# Patient Record
Sex: Male | Born: 1950 | Race: Black or African American | Hispanic: No | Marital: Married | Smoking: Never smoker
Health system: Southern US, Community
[De-identification: ages and names within clinical notes are randomized; demographics above are authoritative.]

## PROBLEM LIST (undated history)

## (undated) DIAGNOSIS — I4891 Unspecified atrial fibrillation: Secondary | ICD-10-CM

## (undated) DIAGNOSIS — Z8719 Personal history of other diseases of the digestive system: Secondary | ICD-10-CM

## (undated) DIAGNOSIS — N529 Male erectile dysfunction, unspecified: Secondary | ICD-10-CM

## (undated) DIAGNOSIS — Z8711 Personal history of peptic ulcer disease: Secondary | ICD-10-CM

## (undated) DIAGNOSIS — R569 Unspecified convulsions: Secondary | ICD-10-CM

## (undated) DIAGNOSIS — I10 Essential (primary) hypertension: Secondary | ICD-10-CM

## (undated) DIAGNOSIS — I639 Cerebral infarction, unspecified: Secondary | ICD-10-CM

## (undated) DIAGNOSIS — E785 Hyperlipidemia, unspecified: Secondary | ICD-10-CM

## (undated) DIAGNOSIS — K5790 Diverticulosis of intestine, part unspecified, without perforation or abscess without bleeding: Secondary | ICD-10-CM

## (undated) DIAGNOSIS — E221 Hyperprolactinemia: Secondary | ICD-10-CM

## (undated) DIAGNOSIS — N400 Enlarged prostate without lower urinary tract symptoms: Secondary | ICD-10-CM

## (undated) DIAGNOSIS — N189 Chronic kidney disease, unspecified: Secondary | ICD-10-CM

## (undated) DIAGNOSIS — D126 Benign neoplasm of colon, unspecified: Secondary | ICD-10-CM

## (undated) DIAGNOSIS — Z86718 Personal history of other venous thrombosis and embolism: Secondary | ICD-10-CM

## (undated) DIAGNOSIS — R972 Elevated prostate specific antigen [PSA]: Secondary | ICD-10-CM

## (undated) DIAGNOSIS — E559 Vitamin D deficiency, unspecified: Secondary | ICD-10-CM

## (undated) DIAGNOSIS — I499 Cardiac arrhythmia, unspecified: Secondary | ICD-10-CM

## (undated) DIAGNOSIS — R339 Retention of urine, unspecified: Secondary | ICD-10-CM

## (undated) DIAGNOSIS — K259 Gastric ulcer, unspecified as acute or chronic, without hemorrhage or perforation: Secondary | ICD-10-CM

## (undated) HISTORY — DX: Elevated prostate specific antigen (PSA): R97.20

## (undated) HISTORY — DX: Cerebral infarction, unspecified: I63.9

## (undated) HISTORY — PX: COLONOSCOPY: SHX174

## (undated) HISTORY — DX: Benign prostatic hyperplasia without lower urinary tract symptoms: N40.0

## (undated) HISTORY — DX: Personal history of peptic ulcer disease: Z87.11

## (undated) HISTORY — DX: Personal history of other diseases of the digestive system: Z87.19

## (undated) HISTORY — DX: Essential (primary) hypertension: I10

## (undated) HISTORY — DX: Hyperprolactinemia: E22.1

## (undated) HISTORY — DX: Unspecified convulsions: R56.9

## (undated) HISTORY — DX: Personal history of other venous thrombosis and embolism: Z86.718

## (undated) HISTORY — DX: Male erectile dysfunction, unspecified: N52.9

## (undated) HISTORY — DX: Hyperlipidemia, unspecified: E78.5

---

## 1982-08-25 HISTORY — PX: OTHER SURGICAL HISTORY: SHX169

## 1989-08-25 HISTORY — PX: SHOULDER SURGERY: SHX246

## 2006-07-13 ENCOUNTER — Ambulatory Visit: Payer: Self-pay | Admitting: Gastroenterology

## 2006-12-01 ENCOUNTER — Ambulatory Visit: Payer: Self-pay | Admitting: Gastroenterology

## 2009-04-02 ENCOUNTER — Ambulatory Visit: Payer: Self-pay | Admitting: Urology

## 2010-02-18 ENCOUNTER — Ambulatory Visit: Payer: Self-pay | Admitting: Gastroenterology

## 2010-10-28 ENCOUNTER — Ambulatory Visit: Payer: Self-pay | Admitting: Internal Medicine

## 2014-10-18 ENCOUNTER — Ambulatory Visit (INDEPENDENT_AMBULATORY_CARE_PROVIDER_SITE_OTHER): Payer: Medicare Other | Admitting: Endocrinology

## 2014-10-18 ENCOUNTER — Encounter: Payer: Self-pay | Admitting: Endocrinology

## 2014-10-18 VITALS — BP 130/84 | HR 72 | Resp 12 | Ht 63.0 in | Wt 193.2 lb

## 2014-10-18 DIAGNOSIS — K259 Gastric ulcer, unspecified as acute or chronic, without hemorrhage or perforation: Secondary | ICD-10-CM | POA: Insufficient documentation

## 2014-10-18 DIAGNOSIS — E229 Hyperfunction of pituitary gland, unspecified: Secondary | ICD-10-CM

## 2014-10-18 DIAGNOSIS — R7989 Other specified abnormal findings of blood chemistry: Secondary | ICD-10-CM

## 2014-10-18 DIAGNOSIS — I82409 Acute embolism and thrombosis of unspecified deep veins of unspecified lower extremity: Secondary | ICD-10-CM | POA: Insufficient documentation

## 2014-10-18 DIAGNOSIS — N529 Male erectile dysfunction, unspecified: Secondary | ICD-10-CM | POA: Diagnosis not present

## 2014-10-18 DIAGNOSIS — N400 Enlarged prostate without lower urinary tract symptoms: Secondary | ICD-10-CM | POA: Insufficient documentation

## 2014-10-18 DIAGNOSIS — I4891 Unspecified atrial fibrillation: Secondary | ICD-10-CM | POA: Insufficient documentation

## 2014-10-18 DIAGNOSIS — I10 Essential (primary) hypertension: Secondary | ICD-10-CM | POA: Insufficient documentation

## 2014-10-18 DIAGNOSIS — E559 Vitamin D deficiency, unspecified: Secondary | ICD-10-CM | POA: Insufficient documentation

## 2014-10-18 DIAGNOSIS — E785 Hyperlipidemia, unspecified: Secondary | ICD-10-CM | POA: Insufficient documentation

## 2014-10-18 DIAGNOSIS — R972 Elevated prostate specific antigen [PSA]: Secondary | ICD-10-CM | POA: Insufficient documentation

## 2014-10-18 DIAGNOSIS — R569 Unspecified convulsions: Secondary | ICD-10-CM | POA: Insufficient documentation

## 2014-10-18 LAB — T4, FREE: Free T4: 0.96 ng/dL (ref 0.60–1.60)

## 2014-10-18 LAB — T3, FREE: T3 FREE: 4.1 pg/mL (ref 2.3–4.2)

## 2014-10-18 LAB — TSH: TSH: 1.72 u[IU]/mL (ref 0.35–4.50)

## 2014-10-18 NOTE — Patient Instructions (Signed)
Obtain last MRI results from [facility] for the pituitary. Labs today to screen for associated pituitary hormones given elevated prolactin levels.    Please come back for a follow-up appointment in 3 months

## 2014-10-18 NOTE — Assessment & Plan Note (Signed)
Chronic after diagnosis of stroke. Being managed by Urology. On treatment with Viagra. Rule out associated hypogonadism and thyroid dysfunction.

## 2014-10-18 NOTE — Assessment & Plan Note (Signed)
Discussed with the patient regarding PRL hormone, usual role in lactation and causes of elevated PRL levels including pituitary causes, drug induced. Suspect that he has mild elevation in his PRL levels from concurrent verapamil use.  Denies any symptoms of hyperPRL and I will screen him for thyroid dysfunction, hypogonadism due to his associated diagnosis of ED, which could be related to metabolic or neurogenic causes.   If labs continue to be mildly elevated, then will continue to follow along. Will obtain last MRI result for confirmation of pituitary anatomy and rule out any adenomas.   RTC 3 months

## 2014-10-18 NOTE — Progress Notes (Signed)
Pre visit review using our clinic review tool, if applicable. No additional management support is needed unless otherwise documented below in the visit note. 

## 2014-10-18 NOTE — Progress Notes (Signed)
Reason for visit- elevated prolactin levels  HPI  Dominic Brooks. is a 64 y.o.-year-old male, referred by his Urologist, Zara Council from [facility], for evaluation for elevated prolactin levels. PCP is Dr Baldemar Lenis from [facility].  The patient recently established care with Zara Council for his Urology care. Used to follow Dr Jacqlyn Larsen before this for BPH/elevated PSA. He reports that in the past he has been having regular MRI brain every 3 years for a possible pituitary adenoma, and the last one was done about 3 years ago at [facility]. It is unclear whether he has had elevated prolactin levels since that time or even longer. Recent referral papers show an elevated PRL of 22.2 ( Feb 2016) and report of them being mildy elevated at 17.2 in 07/2012 ( normal 4-15.2). On review of EMR, it appears that a pituitary hormonal workup was done in 2000, however it appeared normal at that time for cortisol, Thyroid, Testosterone, LH, PRL. ACTH was elevated then, however I am unable to retrieve the old records from 2000. The patient has ED for which he is on Viagra. ED has been an issue since his diagnosis of Stroke in 1990.  He has well controlled HTN and has been on high dose verapamil for several years.  He has pre-Diabetes and is watching his diet/exercise. Last A1c 6.3% jan 2016. He also has decreased GFR with Cr elevated recently to 1.4-1.7 c/w Stage 3 CKD.   He denies any headaches or changes to his vision. He denies any breast discharge or tenderness. He doesn't have fatigue. Denies any tremors or palpitations. Denies any alteration of bowel habits or any changes to his skin or weight.  Denies any changes to his shoe size. Denies any recent exposure to steroids. Denies any nausea, vomiting, abdominal pain, salt craving or skin darkening or low blood pressure. Denies any polyuria or increased thirst. He has been having hot flashes about twice monthly recently, but denies any change in his  shaving frequency, voice, muscle weakness. Has been having occasional nocturnal erections.  I reviewed his chart and he also has a history of preDM, HTN, Chronic kidney disease. Denies any family history of thyroid problems.  I have reviewed the patient's past medical history, family and social history, surgical history, medications and allergies.    Past Medical History  Diagnosis Date  . Hypertension   . Hyperlipidemia   . Stroke   . History of DVT (deep vein thrombosis)   . History of stomach ulcers   . Seizures   . Erectile dysfunction    Past Surgical History  Procedure Laterality Date  . Shoulder surgery  1991  . Thumb surgery  1984   Family History  Problem Relation Age of Onset  . Diabetes Mother   . Hypertension Mother   . Heart disease Father   . Hypertension Father    History   Social History  . Marital Status: Married    Spouse Name: N/A  . Number of Children: N/A  . Years of Education: N/A   Occupational History  . Not on file.   Social History Main Topics  . Smoking status: Never Smoker   . Smokeless tobacco: Never Used  . Alcohol Use: No  . Drug Use: No  . Sexual Activity: Not on file   Other Topics Concern  . Not on file   Social History Narrative  . No narrative on file   No current outpatient prescriptions on file prior to visit.  No current facility-administered medications on file prior to visit.   No Known Allergies    ROS: Review of Systems: [x]  complains of  [  ] denies General:   [  ] Recent weight change [  ] Fatigue  [  ] Loss of appetite Eyes: [  ]  Vision Difficulty [  ]  Eye pain ENT: [  ]  Hearing difficulty [  ]  Difficulty Swallowing CVS: [  ] Chest pain [  ]  Palpitations/Irregular Heart beat [  ]  Shortness of breath lying flat [  ] Swelling of legs Resp: [  ] Frequent Cough [  ] Shortness of Breath  [  ]  Wheezing GI: [  ] Heartburn  [  ] Nausea or Vomiting  [  ] Diarrhea [  ] Constipation  [  ] Abdominal  Pain GU: [  ]  Polyuria  [  ]  nocturia Bones/joints:  [  ]  Muscle aches  [  ] Joint Pain  [  ] Bone pain Skin/Hair/Nails: [  ]  Rash  [  ] New stretch marks [  ]  Itching [  ] Hair loss [  ]  Excessive hair growth Reproduction: [  ] Low sexual desire , [  ]  Women: Menstrual cycle problems [  ]  Women: Breast Discharge [  x] Men: Difficulty with erections [  ]  Men: Enlarged Breasts CNS: [  ] Frequent Headaches [  ] Blurry vision [  ] Tremors [  ] Seizures [  ] Loss of consciousness [  ] Localized weakness Endocrine: [  ]  Excess thirst [  ]  Feeling excessively hot [  ]  Feeling excessively cold Heme: [  ]  Easy bruising [  ]  Enlarged glands or lumps in neck Allergy: [  ]  Food allergies [  ] Environmental allergies  PE: chaperone present BP 130/84 mmHg  Pulse 72  Resp 12  Ht 5\' 3"  (1.6 m)  Wt 193 lb 4 oz (87.658 kg)  BMI 34.24 kg/m2  SpO2 97% Wt Readings from Last 3 Encounters:  10/18/14 193 lb 4 oz (87.658 kg)   HEENT: [place]/AT, EOMI, no icterus, no proptosis, no chemosis, no mild lid lag, no retraction, eyes close completely, gross normal VF testing on confrontation Neck: thyroid gland - smooth, non-tender, no erythema, no tracheal deviation; negative Pemberton's sign; no lymphadenopathy; no bruits Lungs: good air entry, clear bilaterally Heart: S1&S2 normal, regular rate & rhythm; no murmurs, rubs or gallops Breast: no gynecomastia or galactorrhea Abd: soft, NT, ND, no HSM, +BS Ext: no tremor in hands bilaterally, no edema, 2+ DP/PT pulses, good muscle mass Neuro: normal gait, 2+ reflexes bilaterally, normal 5/5 strength, no proximal myopathy  Derm: no pretibial myxoedema/skin dryness Genital: deferred  ASSESSMENT: 1. Elevated prolactin level  PLAN:  Problem List Items Addressed This Visit      Endocrine   Elevated prolactin level - Primary    Discussed with the patient regarding PRL hormone, usual role in lactation and causes of elevated PRL levels including pituitary  causes, drug induced. Suspect that he has mild elevation in his PRL levels from concurrent verapamil use.  Denies any symptoms of hyperPRL and I will screen him for thyroid dysfunction, hypogonadism due to his associated diagnosis of ED, which could be related to metabolic or neurogenic causes.   If labs continue to be mildly elevated, then will continue to follow along. Will obtain  last MRI result for confirmation of pituitary anatomy and rule out any adenomas.   RTC 3 months      Relevant Orders   TSH (Completed)   T4, free (Completed)   Prolactin   FSH/LH   Testosterone,Free and Total   T3, free (Completed)     Genitourinary   Erectile dysfunction    Chronic after diagnosis of stroke. Being managed by Urology. On treatment with Viagra. Rule out associated hypogonadism and thyroid dysfunction.          RTC 3 months  Takiyah Bohnsack [facility]  10/18/2014 3:14 PM

## 2014-10-19 LAB — FSH/LH
FSH: 4.7 m[IU]/mL (ref 1.4–18.1)
LH: 4.6 m[IU]/mL (ref 1.5–9.3)

## 2014-10-19 LAB — PROLACTIN: Prolactin: 10.4 ng/mL (ref 2.1–17.1)

## 2014-10-20 LAB — TESTOSTERONE,FREE AND TOTAL
TESTOSTERONE FREE: 13.9 pg/mL (ref 6.6–18.1)
Testosterone: 522 ng/dL (ref 348–1197)

## 2014-12-12 ENCOUNTER — Encounter: Payer: Self-pay | Admitting: Internal Medicine

## 2014-12-13 ENCOUNTER — Encounter: Payer: Self-pay | Admitting: Internal Medicine

## 2015-01-17 ENCOUNTER — Ambulatory Visit: Payer: Medicare Other | Admitting: Endocrinology

## 2015-03-26 ENCOUNTER — Ambulatory Visit
Admission: RE | Admit: 2015-03-26 | Discharge: 2015-03-26 | Disposition: A | Discharge status: Home / Self Care | Payer: Medicare Other | Source: Ambulatory Visit | Attending: Gastroenterology | Admitting: Gastroenterology

## 2015-03-26 ENCOUNTER — Ambulatory Visit: Payer: Medicare Other | Admitting: Anesthesiology

## 2015-03-26 ENCOUNTER — Encounter
Admission: RE | Disposition: A | Discharge status: Home / Self Care | Payer: Self-pay | Source: Ambulatory Visit | Attending: Gastroenterology

## 2015-03-26 DIAGNOSIS — I10 Essential (primary) hypertension: Secondary | ICD-10-CM | POA: Insufficient documentation

## 2015-03-26 DIAGNOSIS — K648 Other hemorrhoids: Secondary | ICD-10-CM | POA: Insufficient documentation

## 2015-03-26 DIAGNOSIS — Z8711 Personal history of peptic ulcer disease: Secondary | ICD-10-CM | POA: Diagnosis not present

## 2015-03-26 DIAGNOSIS — Z79899 Other long term (current) drug therapy: Secondary | ICD-10-CM | POA: Insufficient documentation

## 2015-03-26 DIAGNOSIS — Z86010 Personal history of colonic polyps: Secondary | ICD-10-CM | POA: Diagnosis present

## 2015-03-26 DIAGNOSIS — Z86718 Personal history of other venous thrombosis and embolism: Secondary | ICD-10-CM | POA: Diagnosis not present

## 2015-03-26 DIAGNOSIS — Z886 Allergy status to analgesic agent status: Secondary | ICD-10-CM | POA: Diagnosis not present

## 2015-03-26 DIAGNOSIS — D120 Benign neoplasm of cecum: Secondary | ICD-10-CM | POA: Insufficient documentation

## 2015-03-26 DIAGNOSIS — Z8673 Personal history of transient ischemic attack (TIA), and cerebral infarction without residual deficits: Secondary | ICD-10-CM | POA: Insufficient documentation

## 2015-03-26 DIAGNOSIS — N529 Male erectile dysfunction, unspecified: Secondary | ICD-10-CM | POA: Diagnosis not present

## 2015-03-26 DIAGNOSIS — D122 Benign neoplasm of ascending colon: Secondary | ICD-10-CM | POA: Diagnosis not present

## 2015-03-26 DIAGNOSIS — E785 Hyperlipidemia, unspecified: Secondary | ICD-10-CM | POA: Diagnosis not present

## 2015-03-26 DIAGNOSIS — Z7901 Long term (current) use of anticoagulants: Secondary | ICD-10-CM | POA: Insufficient documentation

## 2015-03-26 DIAGNOSIS — K5730 Diverticulosis of large intestine without perforation or abscess without bleeding: Secondary | ICD-10-CM | POA: Diagnosis not present

## 2015-03-26 HISTORY — DX: Benign neoplasm of colon, unspecified: D12.6

## 2015-03-26 HISTORY — PX: COLONOSCOPY WITH PROPOFOL: SHX5780

## 2015-03-26 HISTORY — DX: Diverticulosis of intestine, part unspecified, without perforation or abscess without bleeding: K57.90

## 2015-03-26 LAB — PROTIME-INR
INR: 1.1
Prothrombin Time: 14.4 seconds (ref 11.4–15.0)

## 2015-03-26 SURGERY — COLONOSCOPY WITH PROPOFOL
Anesthesia: General

## 2015-03-26 MED ORDER — PROPOFOL 10 MG/ML IV BOLUS
INTRAVENOUS | Status: DC | PRN
  Administered 2015-03-26: 2 mg via INTRAVENOUS
  Administered 2015-03-26 (×2): 20 mg via INTRAVENOUS
  Administered 2015-03-26: 10 mg via INTRAVENOUS
  Administered 2015-03-26 (×3): 20 mg via INTRAVENOUS
  Administered 2015-03-26 (×2): 10 mg via INTRAVENOUS
  Administered 2015-03-26 (×2): 20 mg via INTRAVENOUS

## 2015-03-26 MED ORDER — SODIUM CHLORIDE 0.9 % IV SOLN
INTRAVENOUS | Status: DC
  Administered 2015-03-26: 10:00:00 via INTRAVENOUS

## 2015-03-26 MED ORDER — SODIUM CHLORIDE 0.9 % IV SOLN: INTRAVENOUS | Status: DC

## 2015-03-26 MED ORDER — SODIUM CHLORIDE 0.9 % IV SOLN
INTRAVENOUS | Status: DC
  Administered 2015-03-26: 1000 mL via INTRAVENOUS

## 2015-03-26 MED ORDER — MIDAZOLAM HCL 2 MG/2ML IJ SOLN
INTRAMUSCULAR | Status: DC | PRN
  Administered 2015-03-26: 1 mg via INTRAVENOUS

## 2015-03-26 NOTE — Anesthesia Preprocedure Evaluation (Signed)
Anesthesia Evaluation  Patient identified by MRN, date of birth, ID band Patient awake    Reviewed: Allergy & Precautions, H&P , NPO status , Patient's Chart, lab work & pertinent test results, reviewed documented beta blocker date and time   Airway Mallampati: II  TM Distance: >3 FB Neck ROM: full    Dental no notable dental hx.    Pulmonary neg pulmonary ROS,  breath sounds clear to auscultation  Pulmonary exam normal       Cardiovascular Exercise Tolerance: Good hypertension, negative cardio ROS  Rhythm:regular Rate:Normal     Neuro/Psych Seizures -,  CVA, No Residual Symptoms negative neurological ROS  negative psych ROS   GI/Hepatic negative GI ROS, Neg liver ROS, PUD,   Endo/Other  negative endocrine ROS  Renal/GU negative Renal ROS  negative genitourinary   Musculoskeletal   Abdominal   Peds  Hematology negative hematology ROS (+)   Anesthesia Other Findings   Reproductive/Obstetrics negative OB ROS                             Anesthesia Physical Anesthesia Plan  ASA: III  Anesthesia Plan: General   Post-op Pain Management:    Induction:   Airway Management Planned:   Additional Equipment:   Intra-op Plan:   Post-operative Plan:   Informed Consent: I have reviewed the patients History and Physical, chart, labs and discussed the procedure including the risks, benefits and alternatives for the proposed anesthesia with the patient or authorized representative who has indicated his/her understanding and acceptance.   Dental Advisory Given  Plan Discussed with:   Anesthesia Plan Comments:         Anesthesia Quick Evaluation

## 2015-03-26 NOTE — Op Note (Signed)
[facility] Gastroenterology Patient Name: Dominic Brooks Procedure Date: 03/26/2015 9:19 AM MRN: [id number] Account #: [id number] Date of Birth: September 12, 1950 Admit Type: Outpatient Age: 64 Room: [facility] ENDO ROOM 3 Gender: Male Note Status: Finalized Procedure:         Colonoscopy Indications:       Personal history of colonic polyps Providers:         Lollie Sails, MD Referring MD:      Caprice Renshaw (Referring MD) Medicines:         Monitored Anesthesia Care Complications:     No immediate complications. Procedure:         Pre-Anesthesia Assessment:                    - ASA Grade Assessment: III - A patient with severe                     systemic disease.                    After obtaining informed consent, the colonoscope was                     passed under direct vision. Throughout the procedure, the                     patient's blood pressure, pulse, and oxygen saturations                     were monitored continuously. The Colonoscope was                     introduced through the anus and advanced to the the cecum,                     identified by appendiceal orifice and ileocecal valve. The                     colonoscopy was performed without difficulty. The patient                     tolerated the procedure well. The quality of the bowel                     preparation was good. Findings:      Three sessile polyps were found in the cecum. The polyps were 1 to 3 mm       in size. These polyps were removed with a cold biopsy forceps. Resection       and retrieval were complete.      A 18 mm polyp was found in the proximal ascending colon. The polyp was       sessile. The polyp was removed with a hot snare. The polyp was removed       with a lift and cut technique using a hot snare. The polyp was removed       with a piecemeal technique using a hot snare. Resection and retrieval       were complete. One hemostatic clip was successfully placed. There  was no       bleeding at the end of the maneuver.      The digital rectal exam was normal.      Non-bleeding internal hemorrhoids were found during retroflexion and       during anoscopy. The hemorrhoids  were small.      A few small-mouthed diverticula were found in the proximal sigmoid       colon, in the descending colon and in the distal descending colon. Impression:        - Three 1 to 3 mm polyps in the cecum. Resected and                     retrieved.                    - One 18 mm polyp in the proximal ascending colon.                     Resected and retrieved. Clip was placed.                    - Non-bleeding internal hemorrhoids.                    - Diverticulosis in the proximal sigmoid colon, in the                     descending colon and in the distal descending colon. Recommendation:    - Await pathology results.                    - Telephone GI clinic for pathology results in 1 week. Procedure Code(s): --- Professional ---                    [phone], Colonoscopy, flexible; with removal of tumor(s),                     polyp(s), or other lesion(s) by snare technique                    45380, 84, Colonoscopy, flexible; with biopsy, single or                     multiple Diagnosis Code(s): --- Professional ---                    211.3, Benign neoplasm of colon                    455.0, Internal hemorrhoids without mention of complication                    V12.72, Personal history of colonic polyps                    562.10, Diverticulosis of colon (without mention of                     hemorrhage) CPT copyright 2014 American Medical Association. All rights reserved. The codes documented in this report are preliminary and upon coder review may  be revised to meet current compliance requirements. Lollie Sails, MD 03/26/2015 10:36:25 AM This report has been signed electronically. Number of Addenda: 0 Note Initiated On: 03/26/2015 9:19 AM Scope Withdrawal Time: 0  hours 20 minutes 17 seconds  Total Procedure Duration: 0 hours 33 minutes 24 seconds       [facility]

## 2015-03-26 NOTE — Anesthesia Postprocedure Evaluation (Signed)
  Anesthesia Post-op Note  Patient: Dominic Brooks.  Procedure(s) Performed: Procedure(s): COLONOSCOPY WITH PROPOFOL (N/A)  Anesthesia type:General  Patient location: PACU  Post pain: Pain level controlled  Post assessment: Post-op Vital signs reviewed, Patient's Cardiovascular Status Stable, Respiratory Function Stable, Patent Airway and No signs of Nausea or vomiting  Post vital signs: Reviewed and stable  Last Vitals:  Filed Vitals:   03/26/15 0724  BP: 143/93  Pulse: 77  Temp: 35.7 C  Resp: 16    Level of consciousness: awake, alert  and patient cooperative  Complications: No apparent anesthesia complications

## 2015-03-26 NOTE — Transfer of Care (Signed)
Immediate Anesthesia Transfer of Care Note  Patient: Dominic Brooks.  Procedure(s) Performed: Procedure(s): COLONOSCOPY WITH PROPOFOL (N/A)  Patient Location: PACU and Endoscopy Unit  Anesthesia Type:General  Level of Consciousness: sedated  Airway & Oxygen Therapy: Patient Spontanous Breathing, Patient connected to nasal cannula oxygen and Patient connected to face mask oxygen  Post-op Assessment: Report given to RN and Post -op Vital signs reviewed and stable  Post vital signs: stable  Complications: No apparent anesthesia complications

## 2015-03-26 NOTE — H&P (Addendum)
Outpatient short stay form Pre-procedure 03/26/2015 8:12 AM Lollie Sails MD  Primary Physician: Dr. Derinda Late  Reason for visit:  Colonoscopy  History of present illness:  Patient is a 64 year old male a personal history of adenomatous colon polyps. He is presenting today for colonoscopy. He has a history also of a sigmoid carcinoid in 2017 that being removed at the time.  He does take Coumadin and lab testing for INR is pending. He does not take aspirin's or other such products.    Current facility-administered medications:  .  0.9 %  sodium chloride infusion, , Intravenous, Continuous, Lollie Sails, MD, Last Rate: 20 mL/hr at 03/26/15 0738, 1,000 mL at 03/26/15 0738 .  0.9 %  sodium chloride infusion, , Intravenous, Continuous, Lollie Sails, MD  Prescriptions prior to admission  Medication Sig Dispense Refill Last Dose  . Cholecalciferol 1000 UNITS tablet Take by mouth.   03/25/2015  . sildenafil (VIAGRA) 100 MG tablet Take 100 mg by mouth as needed.   03/25/2015  . simvastatin (ZOCOR) 40 MG tablet Take 40 mg by mouth daily.   03/25/2015  . verapamil (VERELAN PM) 240 MG 24 hr capsule Take 240 mg by mouth at bedtime.   03/25/2015  . warfarin (COUMADIN) 2 MG tablet Take 2 mg by mouth daily.   03/20/2015     Allergies  Allergen Reactions  . Diona Fanti [Aspirin]      Past Medical History  Diagnosis Date  . Hypertension   . Hyperlipidemia   . Stroke   . History of DVT (deep vein thrombosis)   . History of stomach ulcers   . Seizures   . Erectile dysfunction   . Tubular adenoma of colon   . Diverticulosis     Review of systems:      Physical Exam    Heart and lungs: Regular rate and rhythm without rub or gallop    HEENT: Normocephalic atraumatic eyes are anicteric    Other:     Pertinant exam for procedure: Soft nontender nondistended bowel sounds positive normoactive    Planned proceedures: Colonoscopy and indicated procedures. I have discussed the  risks benefits and complications of procedures to include not limited to bleeding, infection, perforation and the risk of sedation and the patient wishes to proceed.    Lollie Sails, MD Gastroenterology 03/26/2015  8:12 AM     INR 1.1

## 2015-03-27 LAB — SURGICAL PATHOLOGY

## 2015-03-29 ENCOUNTER — Encounter: Payer: Self-pay | Admitting: Gastroenterology

## 2015-08-28 ENCOUNTER — Encounter: Payer: Self-pay | Admitting: *Deleted

## 2015-09-03 ENCOUNTER — Ambulatory Visit: Payer: Self-pay | Admitting: Urology

## 2015-10-15 ENCOUNTER — Other Ambulatory Visit: Payer: Self-pay

## 2015-10-15 ENCOUNTER — Ambulatory Visit (INDEPENDENT_AMBULATORY_CARE_PROVIDER_SITE_OTHER): Payer: Medicare Other | Admitting: Urology

## 2015-10-15 ENCOUNTER — Encounter: Payer: Self-pay | Admitting: Urology

## 2015-10-15 VITALS — BP 144/86 | HR 67 | Ht 62.0 in | Wt 188.7 lb

## 2015-10-15 DIAGNOSIS — N529 Male erectile dysfunction, unspecified: Secondary | ICD-10-CM | POA: Insufficient documentation

## 2015-10-15 DIAGNOSIS — N401 Enlarged prostate with lower urinary tract symptoms: Secondary | ICD-10-CM | POA: Diagnosis not present

## 2015-10-15 DIAGNOSIS — N528 Other male erectile dysfunction: Secondary | ICD-10-CM

## 2015-10-15 DIAGNOSIS — N138 Other obstructive and reflux uropathy: Secondary | ICD-10-CM

## 2015-10-15 MED ORDER — SILDENAFIL CITRATE 100 MG PO TABS: 100.0000 mg | ORAL_TABLET | Freq: Every day | ORAL | Status: DC | PRN

## 2015-10-15 NOTE — Progress Notes (Signed)
10/15/2015 11:22 AM   Dominic Brooks. 04-27-1951 [id number]  Referring provider: Derinda Late, MD [phone] [facility] [place], [place] [address]  Chief Complaint  Patient presents with  . Benign Prostatic Hypertrophy    1 year follow up  . Erectile Dysfunction    HPI: Patient is a 65 year old African American male with erectile dysfunction and BPH with LUTS who presents today for a 1 year follow-up.    Erectile dysfunction His SHIM score is 17, which is mild ED.   He has been having difficulty with erections for the last several years.   His major complaint is maintaining an erection.  His libido is preserved.   His risk factors for ED are age, BPH, hyperlipidemia and CVA.  He denies any painful erections or curvatures with his erections.   He has tried Viagra in the past with good results.       SHIM      10/15/15 1058       SHIM: Over the last 6 months:   How do you rate your confidence that you could get and keep an erection? Moderate     When you had erections with sexual stimulation, how often were your erections hard enough for penetration (entering your partner)? Sometimes (about half the time)     During sexual intercourse, how often were you able to maintain your erection after you had penetrated (entered) your partner? Difficult     During sexual intercourse, how difficult was it to maintain your erection to completion of intercourse? Slightly Difficult     When you attempted sexual intercourse, how often was it satisfactory for you? Slightly Difficult     SHIM Total Score   SHIM 17        Score: 1-7 Severe ED 8-11 Moderate ED 12-16 Mild-Moderate ED 17-21 Mild ED 22-25 No ED  BPH WITH LUTS His IPSS score today is 6, which is mild lower urinary tract symptomatology. He is mostly satisfied with his quality life due to his urinary symptoms.  He denies any dysuria, hematuria or suprapubic pain.   He also denies  any recent fevers, chills, nausea or vomiting.  He does not have a family history of PCa.      IPSS      10/15/15 1000       International Prostate Symptom Score   How often have you had the sensation of not emptying your bladder? Not at All     How often have you had to urinate less than every two hours? Less than half the time     How often have you found you stopped and started again several times when you urinated? Not at All     How often have you found it difficult to postpone urination? Less than 1 in 5 times     How often have you had a weak urinary stream? Not at All     How often have you had to strain to start urination? Not at All     How many times did you typically get up at night to urinate? 3 Times     Total IPSS Score 6     Quality of Life due to urinary symptoms   If you were to spend the rest of your life with your urinary condition just the way it is now how would you feel about that? Mostly Satisfied  Score:  1-7 Mild 8-19 Moderate 20-35 Severe        PMH: Past Medical History  Diagnosis Date  . Hypertension   . Hyperlipidemia   . Stroke ([place])   . History of DVT (deep vein thrombosis)   . History of stomach ulcers   . Seizures ([place])   . Erectile dysfunction   . Tubular adenoma of colon   . Diverticulosis   . Elevated PSA   . Hyperprolactinemia ([place])   . BPH (benign prostatic hyperplasia)     Surgical History: Past Surgical History  Procedure Laterality Date  . Shoulder surgery  1991  . Thumb surgery  1984  . Colonoscopy    . Colonoscopy with propofol N/A 03/26/2015    Procedure: COLONOSCOPY WITH PROPOFOL;  Surgeon: Lollie Sails, MD;  Location: [facility] ENDOSCOPY;  Service: Endoscopy;  Laterality: N/A;    Home Medications:    Medication List       This list is accurate as of: 10/15/15 11:22 AM.  Always use your most recent med list.               Cholecalciferol 1000 units tablet  Take by mouth.     sildenafil 100 MG  tablet  Commonly known as:  VIAGRA  Take 1 tablet (100 mg total) by mouth daily as needed for erectile dysfunction.     simvastatin 40 MG tablet  Commonly known as:  ZOCOR  Take 40 mg by mouth daily.     verapamil 240 MG 24 hr capsule  Commonly known as:  VERELAN PM  Take 240 mg by mouth at bedtime.     warfarin 2 MG tablet  Commonly known as:  COUMADIN  Take 2 mg by mouth daily.        Allergies:  Allergies  Allergen Reactions  . Asa [Aspirin]     Family History: Family History  Problem Relation Age of Onset  . Diabetes Mother   . Hypertension Mother   . Heart disease Father   . Hypertension Father   . Kidney disease Neg Hx   . Prostate cancer Neg Hx     Social History:  reports that he has never smoked. He has never used smokeless tobacco. He reports that he does not drink alcohol or use illicit drugs.  ROS: UROLOGY Frequent Urination?: No Hard to postpone urination?: No Burning/pain with urination?: No Get up at night to urinate?: No Leakage of urine?: No Urine stream starts and stops?: No Trouble starting stream?: No Do you have to strain to urinate?: No Blood in urine?: No Urinary tract infection?: No Sexually transmitted disease?: No Injury to kidneys or bladder?: No Painful intercourse?: No Weak stream?: No Erection problems?: No Penile pain?: No  Gastrointestinal Nausea?: No Vomiting?: No Indigestion/heartburn?: No Diarrhea?: No Constipation?: No  Constitutional Fever: No Night sweats?: No Weight loss?: No Fatigue?: No  Skin Skin rash/lesions?: No Itching?: No  Eyes Blurred vision?: No Double vision?: No  Ears/Nose/Throat Sore throat?: No Sinus problems?: No  Hematologic/Lymphatic Swollen glands?: No Easy bruising?: No  Cardiovascular Leg swelling?: No Chest pain?: No  Respiratory Cough?: No Shortness of breath?: No  Endocrine Excessive thirst?: No  Musculoskeletal Back pain?: No Joint pain?:  No  Neurological Headaches?: No Dizziness?: No  Psychologic Depression?: No Anxiety?: No  Physical Exam: BP 144/86 mmHg  Pulse 67  Ht 5\' 2"  (1.575 m)  Wt 188 lb 11.2 oz (85.594 kg)  BMI 34.51 kg/m2  Constitutional: Well nourished. Alert and  oriented, No acute distress. HEENT: [place] AT, moist mucus membranes. Trachea midline, no masses. Cardiovascular: No clubbing, cyanosis, or edema. Respiratory: Normal respiratory effort, no increased work of breathing. GI: Abdomen is soft, non tender, non distended, no abdominal masses. Liver and spleen not palpable.  No hernias appreciated.  Stool sample for occult testing is not indicated.   GU: No CVA tenderness.  No bladder fullness or masses.  Patient with uncircumcised phallus. Foreskin easily retracted  Urethral meatus is patent.  No penile discharge. No penile lesions or rashes. Scrotum without lesions, cysts, rashes and/or edema.  Testicles are located scrotally bilaterally. No masses are appreciated in the testicles. Left and right epididymis are normal. Rectal: Patient with  normal sphincter tone. Anus and perineum without scarring or rashes. No rectal masses are appreciated. Prostate is approximately 55 grams, no nodules are appreciated. Seminal vesicles are normal. Skin: No rashes, bruises or suspicious lesions. Lymph: No cervical or inguinal adenopathy. Neurologic: Grossly intact, no focal deficits, moving all 4 extremities. Psychiatric: Normal mood and affect.  Laboratory Data: PSA History  2.6 ng/mL on 09/01/2014  Lab Results  Component Value Date   TESTOSTERONE 522 10/18/2014   Lab Results  Component Value Date   TSH 1.72 10/18/2014     Assessment & Plan:    1.  BPH with LUTS:   IPSS score is 6/2.  We will continue to monitor.  He will have his IPSS score, exam and PSA in 12 months.  - PSA  2. Erectile dysfunction:   SHIM score is 17.  He has good response with Viagra 100 mg tablets. He refill sent to his pharmacy. He  will follow-up in one year for SHIM score and exam.   Return in about 1 year (around 10/14/2016) for IPSS score and exam.  These notes generated with voice recognition software. I apologize for typographical errors.  Zara Council, [facility] [address] [place], [place] [address] [phone]

## 2015-10-16 ENCOUNTER — Telehealth: Payer: Self-pay

## 2015-10-16 LAB — PSA: Prostate Specific Ag, Serum: 3.1 ng/mL (ref 0.0–4.0)

## 2015-10-16 NOTE — Telephone Encounter (Signed)
-----   Message from Nori Riis, PA-C sent at 10/16/2015  1:12 PM EST ----- While the patient's PSA is under the acceptable 0.75 ng/mL rise within a year, I would like to have the PSA repeated in 6 months.  His last PSA was 2.6 one year ago.

## 2015-10-16 NOTE — Telephone Encounter (Signed)
LMOM

## 2015-10-17 NOTE — Telephone Encounter (Signed)
Made patient an appt for labs in 6 months for psa draw only   Dominic Brooks

## 2015-10-17 NOTE — Telephone Encounter (Signed)
LMOM

## 2015-10-23 ENCOUNTER — Telehealth: Payer: Self-pay

## 2015-10-23 NOTE — Telephone Encounter (Signed)
-----   Message from Nori Riis, PA-C sent at 10/16/2015  1:12 PM EST ----- While the patient's PSA is under the acceptable 0.75 ng/mL rise within a year, I would like to have the PSA repeated in 6 months.  His last PSA was 2.6 one year ago.

## 2015-10-23 NOTE — Telephone Encounter (Signed)
LMOM

## 2015-10-24 NOTE — Telephone Encounter (Signed)
-----   Message from Nori Riis, PA-C sent at 10/16/2015  1:12 PM EST ----- While the patient's PSA is under the acceptable 0.75 ng/mL rise within a year, I would like to have the PSA repeated in 6 months.  His last PSA was 2.6 one year ago.

## 2015-10-24 NOTE — Telephone Encounter (Signed)
LMOM

## 2015-10-24 NOTE — Telephone Encounter (Signed)
Spoke with pt in reference to PSA results and retesting in 64mo. Pt voiced understanding.

## 2015-10-26 ENCOUNTER — Telehealth: Payer: Self-pay | Admitting: Urology

## 2015-10-26 ENCOUNTER — Other Ambulatory Visit: Payer: Self-pay

## 2015-10-26 DIAGNOSIS — N529 Male erectile dysfunction, unspecified: Secondary | ICD-10-CM

## 2015-10-26 MED ORDER — SILDENAFIL CITRATE 100 MG PO TABS: 100.0000 mg | ORAL_TABLET | Freq: Every day | ORAL | Status: DC | PRN

## 2015-10-26 NOTE — Telephone Encounter (Signed)
[facility]. Resent medication.

## 2015-10-26 NOTE — Telephone Encounter (Signed)
Patient called the office this afternoon regarding his prescription for VIAGRA.  It was sent to [facility] pharmacy in [place], [place] and patient indicates that it is the wrong pharmacy.  Please make the correction in his pharmacy to [facility] [phone]) and send the prescription to this pharmacy.

## 2016-04-14 ENCOUNTER — Other Ambulatory Visit: Payer: Medicare Other

## 2016-04-15 ENCOUNTER — Other Ambulatory Visit: Payer: Self-pay

## 2016-04-15 DIAGNOSIS — N400 Enlarged prostate without lower urinary tract symptoms: Secondary | ICD-10-CM

## 2016-04-16 ENCOUNTER — Other Ambulatory Visit: Payer: Medicare Other

## 2016-04-16 DIAGNOSIS — N400 Enlarged prostate without lower urinary tract symptoms: Secondary | ICD-10-CM

## 2016-04-17 ENCOUNTER — Telehealth: Payer: Self-pay

## 2016-04-17 DIAGNOSIS — R972 Elevated prostate specific antigen [PSA]: Secondary | ICD-10-CM

## 2016-04-17 LAB — PSA: PROSTATE SPECIFIC AG, SERUM: 3.7 ng/mL (ref 0.0–4.0)

## 2016-04-17 MED ORDER — FINASTERIDE 5 MG PO TABS: 5.0000 mg | ORAL_TABLET | Freq: Every day | ORAL | 0 refills | Status: DC

## 2016-04-17 NOTE — Telephone Encounter (Signed)
-----   Message from Nori Riis, PA-C sent at 04/17/2016  8:13 AM EDT ----- PSA is higher than it was six months ago.  I would like him to take finasteride 5 mg daily for 30 days and then recheck his PSA in one month.

## 2016-04-17 NOTE — Telephone Encounter (Signed)
LMOM-medication sent to pharmacy 

## 2016-04-18 NOTE — Telephone Encounter (Signed)
Spoke with pt in reference to PSA and needing finasteride. Pt voiced understanding. Pt stated that he would call back to make lab appt.

## 2016-05-20 ENCOUNTER — Other Ambulatory Visit: Payer: Self-pay

## 2016-05-20 DIAGNOSIS — N400 Enlarged prostate without lower urinary tract symptoms: Secondary | ICD-10-CM

## 2016-05-21 ENCOUNTER — Other Ambulatory Visit: Payer: Medicare Other

## 2016-05-21 DIAGNOSIS — N400 Enlarged prostate without lower urinary tract symptoms: Secondary | ICD-10-CM

## 2016-05-22 ENCOUNTER — Telehealth: Payer: Self-pay

## 2016-05-22 LAB — PSA: Prostate Specific Ag, Serum: 2.6 ng/mL (ref 0.0–4.0)

## 2016-05-22 NOTE — Telephone Encounter (Signed)
LMOM

## 2016-05-22 NOTE — Telephone Encounter (Signed)
-----   Message from Nori Riis, PA-C sent at 05/22/2016  8:01 AM EDT ----- Patient's PSA has reduced.  We will see him in 6 months.

## 2016-05-23 NOTE — Telephone Encounter (Signed)
Pt called and I read message back to him.

## 2016-05-23 NOTE — Telephone Encounter (Signed)
LMOM

## 2016-05-26 NOTE — Telephone Encounter (Signed)
Spoke with pt in reference to PSA results. Pt voiced understanding.  

## 2016-10-13 ENCOUNTER — Ambulatory Visit (INDEPENDENT_AMBULATORY_CARE_PROVIDER_SITE_OTHER): Payer: Medicare Other | Admitting: Urology

## 2016-10-13 ENCOUNTER — Encounter: Payer: Self-pay | Admitting: Urology

## 2016-10-13 ENCOUNTER — Ambulatory Visit: Payer: Medicare Other | Admitting: Urology

## 2016-10-13 VITALS — BP 145/85 | HR 75 | Ht 63.0 in | Wt 188.0 lb

## 2016-10-13 DIAGNOSIS — R972 Elevated prostate specific antigen [PSA]: Secondary | ICD-10-CM | POA: Diagnosis not present

## 2016-10-13 DIAGNOSIS — N401 Enlarged prostate with lower urinary tract symptoms: Secondary | ICD-10-CM | POA: Diagnosis not present

## 2016-10-13 DIAGNOSIS — N138 Other obstructive and reflux uropathy: Secondary | ICD-10-CM | POA: Diagnosis not present

## 2016-10-13 DIAGNOSIS — R351 Nocturia: Secondary | ICD-10-CM

## 2016-10-13 DIAGNOSIS — N529 Male erectile dysfunction, unspecified: Secondary | ICD-10-CM

## 2016-10-13 MED ORDER — SILDENAFIL CITRATE 100 MG PO TABS
100.0000 mg | ORAL_TABLET | Freq: Every day | ORAL | 12 refills | Status: DC | PRN
Start: 2016-10-13 — End: 2018-11-10

## 2016-10-13 MED ORDER — FINASTERIDE 5 MG PO TABS: 5.0000 mg | ORAL_TABLET | Freq: Every day | ORAL | 3 refills | Status: DC

## 2016-10-13 NOTE — Progress Notes (Signed)
10/13/2016 10:38 AM   Dominic Brooks. 08/04/1951 [id number]  Referring provider: Derinda Late, MD [phone] [address] [place], [place] [address]  Chief Complaint  Patient presents with  . Erectile Dysfunction    1year    HPI: Patient is a 66 year old [place] American male with erectile dysfunction and BPH with LUTS who presents today for a 1 year follow-up with his wife, Dominic Brooks.    Erectile dysfunction His SHIM score is 16, which is mild ED.   His previous SHIM score was 17.  He has been having difficulty with erections for the last several years.   His major complaint is maintaining an erection.  His libido is preserved.   His risk factors for ED are age, BPH, hyperlipidemia and CVA.  He denies any painful erections or curvatures with his erections.   He has tried Viagra in the past with good results.     SHIM    Row Name 10/13/16 0956         SHIM: Over the last 6 months:   How do you rate your confidence that you could get and keep an erection? Moderate     When you had erections with sexual stimulation, how often were your erections hard enough for penetration (entering your partner)? Sometimes (about half the time)     During sexual intercourse, how often were you able to maintain your erection after you had penetrated (entered) your partner? Difficult     During sexual intercourse, how difficult was it to maintain your erection to completion of intercourse? Slightly Difficult     When you attempted sexual intercourse, how often was it satisfactory for you? Difficult       SHIM Total Score   SHIM 16        Score: 1-7 Severe ED 8-11 Moderate ED 12-16 Mild-Moderate ED 17-21 Mild ED 22-25 No ED  BPH WITH LUTS His IPSS score today is 10, which is moderate lower urinary tract symptomatology. He is mostly satisfied with his quality life due to his urinary symptoms.  His previous I PSS score was 6/3.  He denies any  dysuria, hematuria or suprapubic pain.   He also denies any recent fevers, chills, nausea or vomiting.  He does not have a family history of PCa.     IPSS    Row Name 10/13/16 0900         International Prostate Symptom Score   How often have you had the sensation of not emptying your bladder? More than half the time     How often have you had to urinate less than every two hours? Less than half the time     How often have you found you stopped and started again several times when you urinated? Less than 1 in 5 times     How often have you found it difficult to postpone urination? Not at All     How often have you had a weak urinary stream? Not at All     How often have you had to strain to start urination? Not at All     How many times did you typically get up at night to urinate? 3 Times     Total IPSS Score 10       Quality of Life due to urinary symptoms   If you were to spend the rest of your life with your urinary condition just  the way it is now how would you feel about that? Mostly Satisfied        Score:  1-7 Mild 8-19 Moderate 20-35 Severe  Increase in PSA velocity Patient had an increase from 2.6 to 3.7 last year.  His most recent PSA was 2.6 in 09/17.    PMH: Past Medical History:  Diagnosis Date  . BPH (benign prostatic hyperplasia)   . Diverticulosis   . Elevated PSA   . Erectile dysfunction   . History of DVT (deep vein thrombosis)   . History of stomach ulcers   . Hyperlipidemia   . Hyperprolactinemia ([place])   . Hypertension   . Seizures ([place])   . Stroke ([place])   . Tubular adenoma of colon     Surgical History: Past Surgical History:  Procedure Laterality Date  . COLONOSCOPY    . COLONOSCOPY WITH PROPOFOL N/A 03/26/2015   Procedure: COLONOSCOPY WITH PROPOFOL;  Surgeon: Lollie Sails, MD;  Location[facility] ENDOSCOPY;  Service: Endoscopy;  Laterality: N/A;  . SHOULDER SURGERY  1991  . thumb surgery  1984    Home Medications:  Allergies as of  10/13/2016      Reactions   Asa [aspirin]       Medication List       Accurate as of 10/13/16 10:38 AM. Always use your most recent med list.          Cholecalciferol 1000 units tablet Take by mouth.   finasteride 5 MG tablet Commonly known as:  PROSCAR Take 1 tablet (5 mg total) by mouth daily.   sildenafil 100 MG tablet Commonly known as:  VIAGRA Take 1 tablet (100 mg total) by mouth daily as needed for erectile dysfunction.   simvastatin 40 MG tablet Commonly known as:  ZOCOR Take 40 mg by mouth daily.   verapamil 240 MG 24 hr capsule Commonly known as:  VERELAN PM Take 240 mg by mouth at bedtime.   warfarin 2 MG tablet Commonly known as:  COUMADIN Take 2 mg by mouth daily.       Allergies:  Allergies  Allergen Reactions  . Asa [Aspirin]     Family History: Family History  Problem Relation Age of Onset  . Diabetes Mother   . Hypertension Mother   . Heart disease Father   . Hypertension Father   . Kidney disease Neg Hx   . Prostate cancer Neg Hx     Social History:  reports that he has never smoked. He has never used smokeless tobacco. He reports that he does not drink alcohol or use drugs.  ROS: UROLOGY Frequent Urination?: No Hard to postpone urination?: No Burning/pain with urination?: No Get up at night to urinate?: Yes Leakage of urine?: No Urine stream starts and stops?: No Trouble starting stream?: No Do you have to strain to urinate?: No Blood in urine?: No Urinary tract infection?: No Sexually transmitted disease?: No Injury to kidneys or bladder?: No Painful intercourse?: No Weak stream?: No Erection problems?: No Penile pain?: No  Gastrointestinal Nausea?: No Vomiting?: No Indigestion/heartburn?: No Diarrhea?: No Constipation?: No  Constitutional Fever: No Night sweats?: No Weight loss?: No Fatigue?: No  Skin Skin rash/lesions?: No Itching?: No  Eyes Blurred vision?: No Double vision?:  No  Ears/Nose/Throat Sore throat?: No Sinus problems?: No  Hematologic/Lymphatic Swollen glands?: No Easy bruising?: No  Cardiovascular Leg swelling?: Yes Chest pain?: No  Respiratory Cough?: No Shortness of breath?: No  Endocrine Excessive thirst?: No  Musculoskeletal Back pain?:  No Joint pain?: No  Neurological Headaches?: No Dizziness?: No  Psychologic Depression?: No Anxiety?: No  Physical Exam: BP (!) 145/85   Pulse 75   Ht 5\' 3"  (1.6 m)   Wt 188 lb (85.3 kg)   BMI 33.30 kg/m   Constitutional: Well nourished. Alert and oriented, No acute distress. HEENT: [place] AT, moist mucus membranes. Trachea midline, no masses. Cardiovascular: No clubbing, cyanosis, or edema. Respiratory: Normal respiratory effort, no increased work of breathing. GI: Abdomen is soft, non tender, non distended, no abdominal masses. Liver and spleen not palpable.  No hernias appreciated.  Stool sample for occult testing is not indicated.   GU: No CVA tenderness.  No bladder fullness or masses.  Patient with uncircumcised phallus. Foreskin easily retracted  Urethral meatus is patent.  No penile discharge. No penile lesions or rashes. Scrotum without lesions, cysts, rashes and/or edema.  Testicles are located scrotally bilaterally. No masses are appreciated in the testicles. Left and right epididymis are normal. Rectal: Patient with  normal sphincter tone. Anus and perineum without scarring or rashes. No rectal masses are appreciated. Prostate is approximately 55 grams, no nodules are appreciated. Seminal vesicles are normal. Skin: No rashes, bruises or suspicious lesions. Lymph: No cervical or inguinal adenopathy. Neurologic: Grossly intact, no focal deficits, moving all 4 extremities. Psychiatric: Normal mood and affect.  Laboratory Data: PSA History  2.6 ng/mL on 09/01/2014  3.1 ng/mL on 10/15/2015  3.7 ng/mL on 04/16/2016  2.6 ng/mL on 05/21/2016    Lab Results  Component Value Date    TESTOSTERONE 522 10/18/2014   Lab Results  Component Value Date   TSH 1.72 10/18/2014     Assessment & Plan:    1.  BPH with LUTS  - IPSS score is 10/2, it is worsening  - Continue conservative management, avoiding bladder irritants and timed voiding's  - Continue finasteride 5 mg daily; refills given  - Cannot tolerate medication or medication failure, schedule cystoscopy  - RTC pending PSA results  2. Erectile dysfunction  - SHIM score is 16, it is worsening  - Continue Viagra: refills given  - RTC in 12 months for repeat SHIM score and exam   3. Increase in PSA velocity  - PSA is drawn today  - RTC pending PSA results  4. Nocturia  - I explained to the patient that nocturia is often multi-factorial and difficult to treat.  Sleeping disorders, heart conditions, peripheral vascular disease, diabetes, an enlarged prostate for men, an urethral stricture causing bladder outlet obstruction and/or certain medications can contribute to nocturia.  - I have suggested that the patient avoid caffeine after noon and alcohol in the evening.  He or she may also benefit from fluid restrictions after 6:00 in the evening and voiding just prior to bedtime.  - I have explained that research studies have showed that over 84% of patients with sleep apnea reported frequent nighttime urination.   With sleep apnea, oxygen decreases, carbon dioxide increases, the blood become more acidic, the heart rate drops and blood vessels in the lung constrict.  The body is then alerted that something is very wrong. The sleeper must wake enough to reopen the airway. By this time, the heart is racing and experiences a false signal of fluid overload. The heart excretes a hormone-like protein that tells the body to get rid of sodium and water, resulting in nocturia.  -  I also informed the patient that a recent study noted that decreasing sodium intake to 2.3  grams daily, if they don't have issues with hyponatremia, can  also reduce the number of nightly voids  - The patient may benefit from a discussion with his or her primary care physician to see if he or she has risk factors for sleep apnea or other sleep disturbances and obtaining a sleep study.   Return for RTC pending PSA results.  These notes generated with voice recognition software. I apologize for typographical errors.  Zara Council, [facility] [address] [place], [place] [address] [phone]

## 2016-10-14 LAB — PSA: PROSTATE SPECIFIC AG, SERUM: 3.3 ng/mL (ref 0.0–4.0)

## 2016-10-15 ENCOUNTER — Telehealth: Payer: Self-pay

## 2016-10-15 NOTE — Telephone Encounter (Signed)
-----   Message from Nori Riis, PA-C sent at 10/14/2016  7:51 AM EST ----- Please notify the patient that his PSA is a little higher, but it is lower than it was one year ago.  I suggest to continue with 6 month follow ups.

## 2016-10-15 NOTE — Telephone Encounter (Signed)
LMOM

## 2016-10-16 ENCOUNTER — Telehealth: Payer: Self-pay | Admitting: Urology

## 2016-10-16 NOTE — Telephone Encounter (Signed)
Patient called back and I gave him your message  michelle

## 2016-10-16 NOTE — Telephone Encounter (Signed)
LMOM- will send a letter.  

## 2017-03-31 ENCOUNTER — Other Ambulatory Visit: Payer: Self-pay

## 2017-03-31 DIAGNOSIS — R972 Elevated prostate specific antigen [PSA]: Secondary | ICD-10-CM

## 2017-04-01 ENCOUNTER — Other Ambulatory Visit: Payer: Medicare Other

## 2017-04-01 DIAGNOSIS — R972 Elevated prostate specific antigen [PSA]: Secondary | ICD-10-CM

## 2017-04-02 LAB — PSA: Prostate Specific Ag, Serum: 1.1 ng/mL (ref 0.0–4.0)

## 2017-04-07 NOTE — Progress Notes (Deleted)
04/08/2017 9:14 PM   Dominic Brooks. 06-02-1951 [id number]  Referring provider: Derinda Late, MD [phone] [address] [facility] [place] [facility] [place], [place] [address]  No chief complaint on file.   HPI: Patient is a 66 year old [place] American male with a rising PSA, erectile dysfunction and BPH with LUTS who presents today for a 1 year follow-up with his wife, Dominic Brooks.  ***  Erectile dysfunction His SHIM score is ***, which is *** ED.   His previous SHIM score was 16.  He has been having difficulty with erections for the last several years.   His major complaint is maintaining an erection.  His libido is preserved.   His risk factors for ED are age, BPH, hyperlipidemia and CVA.  He denies any painful erections or curvatures with his erections.   He has tried Viagra in the past with good results.   Score: 1-7 Severe ED 8-11 Moderate ED 12-16 Mild-Moderate ED 17-21 Mild ED 22-25 No ED  BPH WITH LUTS His IPSS score today is ***, which is *** lower urinary tract symptomatology. He is *** with his quality life due to his urinary symptoms.  His previous I PSS score was 10/2.  He denies any dysuria, hematuria or suprapubic pain.   He also denies any recent fevers, chills, nausea or vomiting.  He does not have a family history of PCa.   Score:  1-7 Mild 8-19 Moderate 20-35 Severe  Increase in PSA velocity Patient had an increase from 2.6 to 3.7 last year.  His most recent PSA is 1.1.    PMH: Past Medical History:  Diagnosis Date  . BPH (benign prostatic hyperplasia)   . Diverticulosis   . Elevated PSA   . Erectile dysfunction   . History of DVT (deep vein thrombosis)   . History of stomach ulcers   . Hyperlipidemia   . Hyperprolactinemia (Dominic Brooks)   . Hypertension   . Seizures (Dominic Brooks)   . Stroke (Dominic Brooks)   . Tubular adenoma of colon     Surgical History: Past Surgical History:  Procedure Laterality Date  . COLONOSCOPY    . COLONOSCOPY WITH  PROPOFOL N/A 03/26/2015   Procedure: COLONOSCOPY WITH PROPOFOL;  Surgeon: Lollie Sails, MD;  Location: [facility] ENDOSCOPY;  Service: Endoscopy;  Laterality: N/A;  . SHOULDER SURGERY  1991  . thumb surgery  1984    Home Medications:  Allergies as of 04/08/2017      Reactions   Asa [aspirin]       Medication List       Accurate as of 04/07/17  9:14 PM. Always use your most recent med list.          Cholecalciferol 1000 units tablet Take by mouth.   finasteride 5 MG tablet Commonly known as:  PROSCAR Take 1 tablet (5 mg total) by mouth daily.   sildenafil 100 MG tablet Commonly known as:  VIAGRA Take 1 tablet (100 mg total) by mouth daily as needed for erectile dysfunction.   simvastatin 40 MG tablet Commonly known as:  ZOCOR Take 40 mg by mouth daily.   verapamil 240 MG 24 hr capsule Commonly known as:  VERELAN PM Take 240 mg by mouth at bedtime.   warfarin 2 MG tablet Commonly known as:  COUMADIN Take 2 mg by mouth daily.       Allergies:  Allergies  Allergen Reactions  . Diona Fanti [Aspirin]     Family History: Family  History  Problem Relation Age of Onset  . Diabetes Mother   . Hypertension Mother   . Heart disease Father   . Hypertension Father   . Kidney disease Neg Hx   . Prostate cancer Neg Hx     Social History:  reports that he has never smoked. He has never used smokeless tobacco. He reports that he does not drink alcohol or use drugs.  ROS:                                        Physical Exam: There were no vitals taken for this visit.  Constitutional: Well nourished. Alert and oriented, No acute distress. HEENT: [place] AT, moist mucus membranes. Trachea midline, no masses. Cardiovascular: No clubbing, cyanosis, or edema. Respiratory: Normal respiratory effort, no increased work of breathing. GI: Abdomen is soft, non tender, non distended, no abdominal masses. Liver and spleen not palpable.  No hernias appreciated.  Stool  sample for occult testing is not indicated.   GU: No CVA tenderness.  No bladder fullness or masses.  Patient with uncircumcised phallus. Foreskin easily retracted  Urethral meatus is patent.  No penile discharge. No penile lesions or rashes. Scrotum without lesions, cysts, rashes and/or edema.  Testicles are located scrotally bilaterally. No masses are appreciated in the testicles. Left and right epididymis are normal. Rectal: Patient with  normal sphincter tone. Anus and perineum without scarring or rashes. No rectal masses are appreciated. Prostate is approximately 55 grams, no nodules are appreciated. Seminal vesicles are normal. Skin: No rashes, bruises or suspicious lesions. Lymph: No cervical or inguinal adenopathy. Neurologic: Grossly intact, no focal deficits, moving all 4 extremities. Psychiatric: Normal mood and affect.  Laboratory Data: PSA History  2.6 ng/mL on 09/01/2014  3.1 ng/mL on 10/15/2015  3.7 ng/mL on 04/16/2016  2.6 ng/mL on 05/21/2016  1.1 ng/mL on 04/01/2017   I have reviewed the labs   Assessment & Plan:    1.  BPH with LUTS  - IPSS score is 10/2, it is worsening  - Continue conservative management, avoiding bladder irritants and timed voiding's  - Continue finasteride 5 mg daily; refills given  - RTC pending PSA results  2. Erectile dysfunction  - SHIM score is 16, it is worsening  - Continue Viagra: refills given  - RTC in 12 months for repeat SHIM score and exam   3. Increase in PSA velocity  - PSA has returned to baseline  - RTC in 6 months for PSA and exam  4. Nocturia  - I explained to the patient that nocturia is often multi-factorial and difficult to treat.  Sleeping disorders, heart conditions, peripheral vascular disease, diabetes, an enlarged prostate for men, an urethral stricture causing bladder outlet obstruction and/or certain medications can contribute to nocturia.  - I have suggested that the patient avoid caffeine after noon and  alcohol in the evening.  He or she may also benefit from fluid restrictions after 6:00 in the evening and voiding just prior to bedtime.  - I have explained that research studies have showed that over 84% of patients with sleep apnea reported frequent nighttime urination.   With sleep apnea, oxygen decreases, carbon dioxide increases, the blood become more acidic, the heart rate drops and blood vessels in the lung constrict.  The body is then alerted that something is very wrong. The sleeper must wake enough to reopen the  airway. By this time, the heart is racing and experiences a false signal of fluid overload. The heart excretes a hormone-like protein that tells the body to get rid of sodium and water, resulting in nocturia.  -  I also informed the patient that a recent study noted that decreasing sodium intake to 2.3 grams daily, if they don't have issues with hyponatremia, can also reduce the number of nightly voids  - The patient may benefit from a discussion with his or her primary care physician to see if he or she has risk factors for sleep apnea or other sleep disturbances and obtaining a sleep study.   No Follow-up on file.  These notes generated with voice recognition software. I apologize for typographical errors.  Zara Council, [facility] [address], [place], [place] [address] [phone]

## 2017-04-08 ENCOUNTER — Ambulatory Visit: Payer: Medicare Other | Admitting: Urology

## 2017-09-28 ENCOUNTER — Other Ambulatory Visit: Payer: Self-pay | Admitting: Urology

## 2017-09-28 DIAGNOSIS — R972 Elevated prostate specific antigen [PSA]: Secondary | ICD-10-CM

## 2017-12-04 NOTE — Progress Notes (Deleted)
12/07/2017 8:45 AM   Dominic Brooks. October 01, 1950 [id number]  Referring provider: Derinda Late, MD [phone] [address] [place] [facility] [place], [place] [address]  No chief complaint on file.   HPI: Patient is a 67 year old African American male with erectile dysfunction and BPH with LUTS who presents today for a 1 year follow-up with his wife, Dominic Brooks.    Erectile dysfunction His SHIM score is ***, which is *** ED.   His previous SHIM score was 16.  He has been having difficulty with erections for the last several years.   His major complaint is maintaining an erection.  His libido is preserved.   His risk factors for ED are age, BPH, hyperlipidemia and CVA.  He denies any painful erections or curvatures with his erections.   He has tried Viagra in the past with good results.   Score: 1-7 Severe ED 8-11 Moderate ED 12-16 Mild-Moderate ED 17-21 Mild ED 22-25 No ED  BPH WITH LUTS His IPSS score today is ***, which is *** lower urinary tract symptomatology. He is *** with his quality life due to his urinary symptoms.  His previous I PSS score was 10/2.  He denies any dysuria, hematuria or suprapubic pain.   He also denies any recent fevers, chills, nausea or vomiting.  He does not have a family history of PCa.   Score:  1-7 Mild 8-19 Moderate 20-35 Severe  Increase in PSA velocity Patient had an increase from 2.6 to 3.7 last year.  His most recent PSA was 1.1 in 03/2017.  PMH: Past Medical History:  Diagnosis Date  . BPH (benign prostatic hyperplasia)   . Diverticulosis   . Elevated PSA   . Erectile dysfunction   . History of DVT (deep vein thrombosis)   . History of stomach ulcers   . Hyperlipidemia   . Hyperprolactinemia ([place])   . Hypertension   . Seizures ([place])   . Stroke ([place])   . Tubular adenoma of colon     Surgical History: *** The histories are not reviewed yet. Please review them in the "History" navigator section and  refresh this [place].  Home Medications:  Allergies as of 12/07/2017      Reactions   Asa [aspirin]       Medication List        Accurate as of 12/04/17  8:45 AM. Always use your most recent med list.          Cholecalciferol 1000 units tablet Take by mouth.   finasteride 5 MG tablet Commonly known as:  PROSCAR Take 1 tablet (5 mg total) by mouth daily.   sildenafil 100 MG tablet Commonly known as:  VIAGRA Take 1 tablet (100 mg total) by mouth daily as needed for erectile dysfunction.   simvastatin 40 MG tablet Commonly known as:  ZOCOR Take 40 mg by mouth daily.   verapamil 240 MG 24 hr capsule Commonly known as:  VERELAN PM Take 240 mg by mouth at bedtime.   warfarin 2 MG tablet Commonly known as:  COUMADIN Take 2 mg by mouth daily.       Allergies:  Allergies  Allergen Reactions  . Asa [Aspirin]     Family History: Family History  Problem Relation Age of Onset  . Diabetes Mother   . Hypertension Mother   . Heart disease Father   . Hypertension Father   . Kidney disease Neg Hx   . Prostate  cancer Neg Hx     Social History:  reports that he has never smoked. He has never used smokeless tobacco. He reports that he does not drink alcohol or use drugs.  ROS:                                        Physical Exam: There were no vitals taken for this visit.  Constitutional: Well nourished. Alert and oriented, No acute distress. HEENT: [place] AT, moist mucus membranes. Trachea midline, no masses. Cardiovascular: No clubbing, cyanosis, or edema. Respiratory: Normal respiratory effort, no increased work of breathing. GI: Abdomen is soft, non tender, non distended, no abdominal masses. Liver and spleen not palpable.  No hernias appreciated.  Stool sample for occult testing is not indicated.   GU: No CVA tenderness.  No bladder fullness or masses.  Patient with uncircumcised phallus.   Foreskin easily retracted   Urethral meatus is  patent.  No penile discharge. No penile lesions or rashes. Scrotum without lesions, cysts, rashes and/or edema.  Testicles are located scrotally bilaterally. No masses are appreciated in the testicles. Left and right epididymis are normal. Rectal: Patient with  normal sphincter tone. Anus and perineum without scarring or rashes. No rectal masses are appreciated. Prostate is approximately 55 grams, no nodules are appreciated. Seminal vesicles are normal. Skin: No rashes, bruises or suspicious lesions. Lymph: No cervical or inguinal adenopathy. Neurologic: Grossly intact, no focal deficits, moving all 4 extremities. Psychiatric: Normal mood and affect.   Laboratory Data: PSA History  2.6 ng/mL on 09/01/2014  3.1 ng/mL on 10/15/2015  3.7 ng/mL on 04/16/2016  2.6 ng/mL on 05/21/2016  1.1 ng/mL on 03/2017    Lab Results  Component Value Date   TESTOSTERONE 522 10/18/2014   Lab Results  Component Value Date   TSH 1.72 10/18/2014     Assessment & Plan:    1.  BPH with LUTS  - IPSS score is 10/2, it is worsening  - Continue conservative management, avoiding bladder irritants and timed voiding's  - Continue finasteride 5 mg daily; refills given  - RTC pending PSA results  2. Erectile dysfunction  - SHIM score is 16, it is worsening  - Continue Viagra: refills given  - RTC in 12 months for repeat SHIM score and exam   3. Increase in PSA velocity  - PSA is drawn today  - RTC pending PSA results  4. Nocturia  - I explained to the patient that nocturia is often multi-factorial and difficult to treat.  Sleeping disorders, heart conditions, peripheral vascular disease, diabetes, an enlarged prostate for men, an urethral stricture causing bladder outlet obstruction and/or certain medications can contribute to nocturia.  - I have suggested that the patient avoid caffeine after noon and alcohol in the evening.  He or she may also benefit from fluid restrictions after 6:00 in the evening  and voiding just prior to bedtime.  - I have explained that research studies have showed that over 84% of patients with sleep apnea reported frequent nighttime urination.   With sleep apnea, oxygen decreases, carbon dioxide increases, the blood become more acidic, the heart rate drops and blood vessels in the lung constrict.  The body is then alerted that something is very wrong. The sleeper must wake enough to reopen the airway. By this time, the heart is racing and experiences a false signal of fluid overload. The  heart excretes a hormone-like protein that tells the body to get rid of sodium and water, resulting in nocturia.  -  I also informed the patient that a recent study noted that decreasing sodium intake to 2.3 grams daily, if they don't have issues with hyponatremia, can also reduce the number of nightly voids  - The patient may benefit from a discussion with his or her primary care physician to see if he or she has risk factors for sleep apnea or other sleep disturbances and obtaining a sleep study.   No follow-ups on file.  These notes generated with voice recognition software. I apologize for typographical errors.  Zara Council, [facility] [address] [place], [place] [address] [phone]

## 2017-12-07 ENCOUNTER — Ambulatory Visit: Payer: Medicare Other | Admitting: Urology

## 2017-12-28 ENCOUNTER — Ambulatory Visit: Payer: Medicare Other | Admitting: Urology

## 2018-01-29 NOTE — Progress Notes (Addendum)
02/01/2018 9:56 AM   Dominic Brooks. [birth year] [id number]  Referring provider: Derinda Late, MD [phone] [address] [facility] [place], [place] [address]  Chief Complaint  Patient presents with  . Elevated PSA  . Benign Prostatic Hypertrophy  . Erectile Dysfunction    HPI: Patient is a 67 year old African American male with erectile dysfunction and BPH with LUTS who presents today for a 1 year follow-up.    Erectile dysfunction His SHIM score is 12, which is mild to moderate ED.   His previous SHIM score was 16.  He has been having difficulty with erections for the last several years.   His major complaint is maintaining an erection.  His libido is preserved.   His risk factors for ED are age, BPH, hyperlipidemia and CVA.  He denies any painful erections or curvatures with his erections.   He has tried Viagra in the past with good results, but he states it is not as effective as it has been in the past.   [place] Name 02/01/18 0927         SHIM: Over the last 6 months:   How do you rate your confidence that you could get and keep an erection?  Moderate     When you had erections with sexual stimulation, how often were your erections hard enough for penetration (entering your partner)?  Sometimes (about half the time)     During sexual intercourse, how often were you able to maintain your erection after you had penetrated (entered) your partner?  A Few Times (much less than half the time)     During sexual intercourse, how difficult was it to maintain your erection to completion of intercourse?  Very Difficult     When you attempted sexual intercourse, how often was it satisfactory for you?  A Few Times (much less than half the time)       SHIM Total Score   SHIM  12        Score: 1-7 Severe ED 8-11 Moderate ED 12-16 Mild-Moderate ED 17-21 Mild ED 22-25 No ED  BPH WITH LUTS His IPSS score today is 5, which is mild lower urinary  tract symptomatology. He is mixed with his quality life due to his urinary symptoms.  His PVR is 53 mL.  His previous I PSS score was 10/2.  He is having nocturia x 1-3.  He had 4 night episodes of waking up with wet underwear.  It has stopped abruptly.  He denies any dysuria, hematuria or suprapubic pain.   He also denies any recent fevers, chills, nausea or vomiting.  He does not have a family history of PCa. IPSS    Row Name 02/01/18 0900         International Prostate Symptom Score   How often have you had the sensation of not emptying your bladder?  Not at All     How often have you had to urinate less than every two hours?  Less than 1 in 5 times     How often have you found you stopped and started again several times when you urinated?  Not at All     How often have you found it difficult to postpone urination?  Not at All     How often have you had a weak urinary stream?  Not at All     How often have you had  to strain to start urination?  Less than 1 in 5 times     How many times did you typically get up at night to urinate?  3 Times     Total IPSS Score  5       Quality of Life due to urinary symptoms   If you were to spend the rest of your life with your urinary condition just the way it is now how would you feel about that?  Mixed        Score:  1-7 Mild 8-19 Moderate 20-35 Severe  History of increase in PSA velocity PSA trend  2.6 on  08/30/2014  3.1 on 10/15/2015  3.7 on 04/16/2016  2.6 on 05/21/2016  3.3 on 10/13/2016  1.1 on 04/01/2017  1.38 on 05/29/2017  PMH: Past Medical History:  Diagnosis Date  . BPH (benign prostatic hyperplasia)   . Diverticulosis   . Elevated PSA   . Erectile dysfunction   . History of DVT (deep vein thrombosis)   . History of stomach ulcers   . Hyperlipidemia   . Hyperprolactinemia ([place])   . Hypertension   . Seizures ([place])   . Stroke ([place])   . Tubular adenoma of colon     Surgical History: Past Surgical History:    Procedure Laterality Date  . COLONOSCOPY    . COLONOSCOPY WITH PROPOFOL N/A 03/26/2015   Procedure: COLONOSCOPY WITH PROPOFOL;  Surgeon: Lollie Sails, MD;  Location: [facility] ENDOSCOPY;  Service: Endoscopy;  Laterality: N/A;  . SHOULDER SURGERY  1991  . thumb surgery  1984    Home Medications:  Allergies as of 02/01/2018      Reactions   Asa [aspirin]       Medication List        Accurate as of 02/01/18  9:56 AM. Always use your most recent med list.          Cholecalciferol 1000 units tablet Take by mouth.   finasteride 5 MG tablet Commonly known as:  PROSCAR Take 1 tablet (5 mg total) by mouth daily.   sildenafil 100 MG tablet Commonly known as:  VIAGRA Take 1 tablet (100 mg total) by mouth daily as needed for erectile dysfunction.   sildenafil 100 MG tablet Commonly known as:  VIAGRA Take 1 tablet (100 mg total) by mouth daily as needed for erectile dysfunction. Take two hours prior to intercourse on an empty stomach   simvastatin 40 MG tablet Commonly known as:  ZOCOR Take 40 mg by mouth daily.   verapamil 240 MG 24 hr capsule Commonly known as:  VERELAN PM Take 240 mg by mouth at bedtime.   warfarin 2 MG tablet Commonly known as:  COUMADIN Take 2 mg by mouth daily.   warfarin 4 MG tablet Commonly known as:  COUMADIN       Allergies:  Allergies  Allergen Reactions  . Asa [Aspirin]     Family History: Family History  Problem Relation Age of Onset  . Diabetes Mother   . Hypertension Mother   . Heart disease Father   . Hypertension Father   . Kidney disease Neg Hx   . Prostate cancer Neg Hx     Social History:  reports that he has never smoked. He has never used smokeless tobacco. He reports that he does not drink alcohol or use drugs.  ROS: UROLOGY Frequent Urination?: No Hard to postpone urination?: No Burning/pain with urination?: No Get up at night to urinate?: No Leakage of  urine?: No Urine stream starts and stops?: No Trouble  starting stream?: No Do you have to strain to urinate?: No Blood in urine?: No Urinary tract infection?: No Sexually transmitted disease?: No Injury to kidneys or bladder?: No Painful intercourse?: No Weak stream?: No Erection problems?: Yes Penile pain?: No  Gastrointestinal Nausea?: No Vomiting?: No Indigestion/heartburn?: No Diarrhea?: No Constipation?: No  Constitutional Fever: No Night sweats?: No Weight loss?: No Fatigue?: No  Skin Skin rash/lesions?: No Itching?: No  Eyes Blurred vision?: No Double vision?: No  Ears/Nose/Throat Sore throat?: No Sinus problems?: No  Hematologic/Lymphatic Swollen glands?: No Easy bruising?: No  Cardiovascular Leg swelling?: Yes Chest pain?: No  Respiratory Cough?: No Shortness of breath?: No  Endocrine Excessive thirst?: No  Musculoskeletal Back pain?: No Joint pain?: No  Neurological Headaches?: No Dizziness?: Yes  Psychologic Depression?: No Anxiety?: No  Physical Exam: BP 138/82 (BP Location: Right Arm, Patient Position: Sitting, Cuff Size: Large)   Pulse (!) 57   Ht 5\' 4"  (1.626 m)   Wt 190 lb 3.2 oz (86.3 kg)   SpO2 99%   BMI 32.65 kg/m   Constitutional: Well nourished. Alert and oriented, No acute distress. HEENT: [place] AT, moist mucus membranes. Trachea midline, no masses. Cardiovascular: No clubbing, cyanosis, or edema. Respiratory: Normal respiratory effort, no increased work of breathing. GI: Abdomen is soft, non tender, non distended, no abdominal masses. Liver and spleen not palpable.  No hernias appreciated.  Stool sample for occult testing is not indicated.   GU: No CVA tenderness.  No bladder fullness or masses.  Patient with uncircumcised phallus. Foreskin easily retracted  Urethral meatus is patent.  No penile discharge. No penile lesions or rashes. Scrotum without lesions, cysts, rashes and/or edema.  Testicles are located scrotally bilaterally. No masses are appreciated in the  testicles. Left and right epididymis are normal. Rectal: Patient with  normal sphincter tone. Anus and perineum without scarring or rashes. No rectal masses are appreciated. Prostate is approximately 55 grams, no nodules are appreciated. Seminal vesicles are normal. Skin: No rashes, bruises or suspicious lesions. Lymph: No cervical or inguinal adenopathy. Neurologic: Grossly intact, no focal deficits, moving all 4 extremities. Psychiatric: Normal mood and affect.   Laboratory Data: PSA History See above     Lab Results  Component Value Date   TESTOSTERONE 522 10/18/2014   Lab Results  Component Value Date   TSH 1.72 10/18/2014   Pertinent Imaging Results for Dominic Brooks, Dominic Brooks (MRN [id number]) as of 02/01/2018 09:47  Ref. Range 02/01/2018 09:35  Scan Result Unknown 1mL    Assessment & Plan:    1.  BPH with LUTS  - IPSS score is 5/3, it is worsening  - Continue conservative management, avoiding bladder irritants and timed voiding's  - Restart finasteride 5 mg daily; refills given  - RTC pending PSA results  2. Erectile dysfunction  - SHIM score is 12, it is worsening  - Continue Viagra: refills given - will try on an empty stomach  - RTC in 12 months for repeat SHIM score and exam   3. History of increase in PSA velocity PSA drawn today Return to clinic based on PSA results  4. Nocturia  - I explained to the patient that nocturia is often multi-factorial and difficult to treat.  Sleeping disorders, heart conditions, peripheral vascular disease, diabetes, an enlarged prostate for men, an urethral stricture causing bladder outlet obstruction and/or certain medications can contribute to nocturia.  - I have suggested that the  patient avoid caffeine after noon and alcohol in the evening.  He or she may also benefit from fluid restrictions after 6:00 in the evening and voiding just prior to bedtime.  - I have explained that research studies have showed that over 84% of patients  with sleep apnea reported frequent nighttime urination.   With sleep apnea, oxygen decreases, carbon dioxide increases, the blood become more acidic, the heart rate drops and blood vessels in the lung constrict.  The body is then alerted that something is very wrong. The sleeper must wake enough to reopen the airway. By this time, the heart is racing and experiences a false signal of fluid overload. The heart excretes a hormone-like protein that tells the body to get rid of sodium and water, resulting in nocturia.  -  I also informed the patient that a recent study noted that decreasing sodium intake to 2.3 grams daily, if they don't have issues with hyponatremia, can also reduce the number of nightly voids  - The patient may benefit from a discussion with his or her primary care physician to see if he or she has risk factors for sleep apnea or other sleep disturbances and obtaining a sleep study.   Return in about 6 months (around 08/03/2018) for IPSS, SHIM, PSA and exam.  These notes generated with voice recognition software. I apologize for typographical errors.  Zara Council, PA-C  [facility] [address] [place], [place] [address] [phone]

## 2018-02-01 ENCOUNTER — Ambulatory Visit (INDEPENDENT_AMBULATORY_CARE_PROVIDER_SITE_OTHER): Payer: Medicare Other | Admitting: Urology

## 2018-02-01 ENCOUNTER — Encounter: Payer: Self-pay | Admitting: Urology

## 2018-02-01 VITALS — BP 138/82 | HR 57 | Ht 64.0 in | Wt 190.2 lb

## 2018-02-01 DIAGNOSIS — R972 Elevated prostate specific antigen [PSA]: Secondary | ICD-10-CM

## 2018-02-01 DIAGNOSIS — N401 Enlarged prostate with lower urinary tract symptoms: Secondary | ICD-10-CM | POA: Diagnosis not present

## 2018-02-01 DIAGNOSIS — R351 Nocturia: Secondary | ICD-10-CM | POA: Diagnosis not present

## 2018-02-01 DIAGNOSIS — N138 Other obstructive and reflux uropathy: Secondary | ICD-10-CM | POA: Diagnosis not present

## 2018-02-01 DIAGNOSIS — N529 Male erectile dysfunction, unspecified: Secondary | ICD-10-CM

## 2018-02-01 LAB — BLADDER SCAN AMB NON-IMAGING

## 2018-02-01 MED ORDER — FINASTERIDE 5 MG PO TABS: 5.0000 mg | ORAL_TABLET | Freq: Every day | ORAL | 3 refills | Status: DC

## 2018-02-01 MED ORDER — SILDENAFIL CITRATE 100 MG PO TABS: 100.0000 mg | ORAL_TABLET | Freq: Every day | ORAL | 12 refills | Status: DC | PRN

## 2018-02-02 LAB — PSA: PROSTATE SPECIFIC AG, SERUM: 2.1 ng/mL (ref 0.0–4.0)

## 2018-02-03 ENCOUNTER — Telehealth: Payer: Self-pay | Admitting: Family Medicine

## 2018-02-03 NOTE — Telephone Encounter (Signed)
LMOM for patient to return call.

## 2018-02-03 NOTE — Telephone Encounter (Signed)
Made patient aware of slightly increased PSA & to continue taking finasteride as script has been sent to pharmacy. Appointments made for follow up. Questions answered. Patient voices understanding.

## 2018-02-03 NOTE — Telephone Encounter (Signed)
-----   Message from Nori Riis, PA-C sent at 02/02/2018  8:13 AM EDT ----- Please let Mr. Dominic Brooks know that his PSA has increased slightly and this may be due to not taking the finasteride.  I would like to recheck the PSA in 3 months to ensure a downward trend.

## 2018-05-06 ENCOUNTER — Other Ambulatory Visit: Payer: Self-pay | Admitting: Family Medicine

## 2018-05-06 DIAGNOSIS — R972 Elevated prostate specific antigen [PSA]: Secondary | ICD-10-CM

## 2018-05-07 ENCOUNTER — Other Ambulatory Visit: Payer: Medicare Other

## 2018-05-10 ENCOUNTER — Other Ambulatory Visit: Payer: Medicare Other

## 2018-05-10 DIAGNOSIS — R972 Elevated prostate specific antigen [PSA]: Secondary | ICD-10-CM

## 2018-05-11 ENCOUNTER — Telehealth: Payer: Self-pay | Admitting: Urology

## 2018-05-11 LAB — PSA: Prostate Specific Ag, Serum: 0.9 ng/mL (ref 0.0–4.0)

## 2018-05-11 NOTE — Telephone Encounter (Signed)
Please call patient

## 2018-05-11 NOTE — Telephone Encounter (Signed)
-----   Message from Nori Riis, PA-C sent at 05/11/2018  7:30 AM EDT ----- Please let Mr. Dominic Brooks know that his PSA has come down to 0.9.  He should continue the finasteride 5 mg daily and we will see him in December.

## 2018-05-12 ENCOUNTER — Ambulatory Visit (INDEPENDENT_AMBULATORY_CARE_PROVIDER_SITE_OTHER): Payer: Medicare Other | Admitting: Urology

## 2018-05-12 ENCOUNTER — Encounter: Payer: Self-pay | Admitting: Urology

## 2018-05-12 VITALS — BP 126/83 | HR 62 | Ht 63.0 in | Wt 186.5 lb

## 2018-05-12 DIAGNOSIS — N401 Enlarged prostate with lower urinary tract symptoms: Secondary | ICD-10-CM

## 2018-05-12 DIAGNOSIS — N138 Other obstructive and reflux uropathy: Secondary | ICD-10-CM

## 2018-05-12 DIAGNOSIS — N529 Male erectile dysfunction, unspecified: Secondary | ICD-10-CM

## 2018-05-12 DIAGNOSIS — R351 Nocturia: Secondary | ICD-10-CM

## 2018-05-12 NOTE — Progress Notes (Signed)
05/12/2018 12:48 PM   Dominic Brooks. 09/14/1950 [id number]  Referring provider: Derinda Late, MD [phone] [address] [facility] [place], [place] [address]  No chief complaint on file.   HPI: Patient is a 67 year old African American male with erectile dysfunction and BPH with LUTS who presents today for a 1 year follow-up.    Erectile dysfunction His SHIM score is 18, which is mild ED.   His previous SHIM score was 12.  He has been having difficulty with erections for the last several years.   His major complaint is maintaining an erection.  His libido is preserved.   His risk factors for ED are age, BPH, hyperlipidemia and CVA.  He denies any painful erections or curvatures with his erections.   He is still having satisfactory results with Viagra. SHIM    Row Name 05/12/18 0958         SHIM: Over the last 6 months:   How do you rate your confidence that you could get and keep an erection?  Moderate     When you had erections with sexual stimulation, how often were your erections hard enough for penetration (entering your partner)?  Sometimes (about half the time)     During sexual intercourse, how often were you able to maintain your erection after you had penetrated (entered) your partner?  Sometimes (about half the time)     During sexual intercourse, how difficult was it to maintain your erection to completion of intercourse?  Slightly Difficult     When you attempted sexual intercourse, how often was it satisfactory for you?  Most Times (much more than half the time)       SHIM Total Score   SHIM  17        Score: 1-7 Severe ED 8-11 Moderate ED 12-16 Mild-Moderate ED 17-21 Mild ED 22-25 No ED  BPH WITH LUTS His IPSS score today is 3, which is mild lower urinary tract symptomatology. He is mostly satisfied with his quality life due to his urinary symptoms.  His previous I PSS score was 10/2.  His previous PVR was 53 mL.  He denies  any dysuria, hematuria or suprapubic pain.   He also denies any recent fevers, chills, nausea or vomiting.  He does not have a family history of PCa. IPSS    Row Name 05/12/18 1200         International Prostate Symptom Score   How often have you had the sensation of not emptying your bladder?  Not at All     How often have you had to urinate less than every two hours?  Less than 1 in 5 times     How often have you found you stopped and started again several times when you urinated?  Not at All     How often have you found it difficult to postpone urination?  Less than 1 in 5 times     How often have you had a weak urinary stream?  Not at All     How often have you had to strain to start urination?  Not at All     How many times did you typically get up at night to urinate?  1 Time     Total IPSS Score  3       Quality of Life due to urinary symptoms   If you were to spend the rest of  your life with your urinary condition just the way it is now how would you feel about that?  Mostly Satisfied        Score:  1-7 Mild 8-19 Moderate 20-35 Severe  History of increase in PSA velocity PSA trend  2.6 on  08/30/2014  3.1 on 10/15/2015  3.7 on 04/16/2016  2.6 on 05/21/2016  3.3 on 10/13/2016  1.1 on 04/01/2017  1.38 on 05/29/2017  2.1 on 02/01/2018  0.9 on 05/10/2018  PMH: Past Medical History:  Diagnosis Date  . BPH (benign prostatic hyperplasia)   . Diverticulosis   . Elevated PSA   . Erectile dysfunction   . History of DVT (deep vein thrombosis)   . History of stomach ulcers   . Hyperlipidemia   . Hyperprolactinemia ([place])   . Hypertension   . Seizures ([place])   . Stroke ([place])   . Tubular adenoma of colon     Surgical History: Past Surgical History:  Procedure Laterality Date  . COLONOSCOPY    . COLONOSCOPY WITH PROPOFOL N/A 03/26/2015   Procedure: COLONOSCOPY WITH PROPOFOL;  Surgeon: Lollie Sails, MD;  Location[facility] ENDOSCOPY;  Service: Endoscopy;  Laterality:  N/A;  . SHOULDER SURGERY  1991  . thumb surgery  1984    Home Medications:  Allergies as of 05/12/2018      Reactions   Asa [aspirin]       Medication List        Accurate as of 05/12/18 12:48 PM. Always use your most recent med list.          Cholecalciferol 1000 units tablet Take by mouth.   finasteride 5 MG tablet Commonly known as:  PROSCAR Take 1 tablet (5 mg total) by mouth daily.   sildenafil 100 MG tablet Commonly known as:  VIAGRA Take 1 tablet (100 mg total) by mouth daily as needed for erectile dysfunction.   sildenafil 100 MG tablet Commonly known as:  VIAGRA Take 1 tablet (100 mg total) by mouth daily as needed for erectile dysfunction. Take two hours prior to intercourse on an empty stomach   simvastatin 40 MG tablet Commonly known as:  ZOCOR Take 40 mg by mouth daily.   verapamil 240 MG 24 hr capsule Commonly known as:  VERELAN PM Take 240 mg by mouth at bedtime.   warfarin 2 MG tablet Commonly known as:  COUMADIN Take 2 mg by mouth daily.   warfarin 4 MG tablet Commonly known as:  COUMADIN       Allergies:  Allergies  Allergen Reactions  . Asa [Aspirin]     Family History: Family History  Problem Relation Age of Onset  . Diabetes Mother   . Hypertension Mother   . Heart disease Father   . Hypertension Father   . Kidney disease Neg Hx   . Prostate cancer Neg Hx     Social History:  reports that he has never smoked. He has never used smokeless tobacco. He reports that he does not drink alcohol or use drugs.  ROS: UROLOGY Frequent Urination?: No Hard to postpone urination?: No Burning/pain with urination?: Yes Get up at night to urinate?: No Leakage of urine?: Yes Urine stream starts and stops?: No Trouble starting stream?: No Do you have to strain to urinate?: No Blood in urine?: No Urinary tract infection?: No Sexually transmitted disease?: No Injury to kidneys or bladder?: No Painful intercourse?: No Weak stream?:  No Erection problems?: No Penile pain?: No  Gastrointestinal Nausea?: No Vomiting?:  No Indigestion/heartburn?: No Diarrhea?: No Constipation?: No  Constitutional Fever: No Night sweats?: No Weight loss?: No Fatigue?: No  Skin Skin rash/lesions?: No Itching?: No  Eyes Blurred vision?: No Double vision?: No  Ears/Nose/Throat Sore throat?: No Sinus problems?: No  Hematologic/Lymphatic Swollen glands?: No Easy bruising?: No  Cardiovascular Leg swelling?: No Chest pain?: No  Respiratory Cough?: No Shortness of breath?: No  Endocrine Excessive thirst?: No  Musculoskeletal Back pain?: No Joint pain?: No  Neurological Headaches?: No Dizziness?: No  Psychologic Depression?: No Anxiety?: No  Physical Exam: BP 126/83 (BP Location: Left Arm, Patient Position: Sitting, Cuff Size: Normal)   Pulse 62   Ht 5\' 3"  (1.6 m)   Wt 186 lb 8 oz (84.6 kg)   BMI 33.04 kg/m   Constitutional: Well nourished. Alert and oriented, No acute distress. HEENT: [place] AT, moist mucus membranes. Trachea midline, no masses. Cardiovascular: No clubbing, cyanosis, or edema. Respiratory: Normal respiratory effort, no increased work of breathing. GI: Abdomen is soft, non tender, non distended, no abdominal masses. Liver and spleen not palpable.  No hernias appreciated.  Stool sample for occult testing is not indicated.   GU: No CVA tenderness.  No bladder fullness or masses.  Patient with uncircumcised phallus.  Foreskin easily retracted Urethral meatus is patent.  No penile discharge. No penile lesions or rashes. Scrotum without lesions, cysts, rashes and/or edema.  Testicles are located scrotally bilaterally. No masses are appreciated in the testicles. Left and right epididymis are normal. Rectal: Patient with  normal sphincter tone. Anus and perineum without scarring or rashes. No rectal masses are appreciated. Prostate is approximately 50 grams, no nodules are appreciated. Seminal  vesicles are normal. Skin: No rashes, bruises or suspicious lesions. Lymph: No cervical or inguinal adenopathy. Neurologic: Grossly intact, no focal deficits, moving all 4 extremities. Psychiatric: Normal mood and affect.   Laboratory Data: PSA History See above     Lab Results  Component Value Date   TESTOSTERONE 522 10/18/2014   Lab Results  Component Value Date   TSH 1.72 10/18/2014   I have reviewed the labs.   Assessment & Plan:    1.  BPH with LUTS IPSS score is 3/2, it is improving Continue conservative management, avoiding bladder irritants and timed voiding's Continue finasteride 5 mg daily; refills given RTC in 6 months for I PSS, PSA and exam  2. Erectile dysfunction SHIM score is 18, it is improving Continue Viagra: refills given  RTC in 6 months for repeat SHIM score and exam   3. History of increase in PSA velocity PSA has returned to baseline  4. Nocturia Improved with fluid restriction   Return in about 6 months (around 11/10/2018) for IPSS, SHIM, PSA and exam.  These notes generated with voice recognition software. I apologize for typographical errors.  Zara Council, PA-C  [facility] [address] [place], [place] [address] [phone]

## 2018-05-12 NOTE — Telephone Encounter (Signed)
Patient notified

## 2018-08-02 ENCOUNTER — Other Ambulatory Visit: Payer: Medicare Other

## 2018-08-09 ENCOUNTER — Ambulatory Visit: Payer: Medicare Other | Admitting: Urology

## 2018-08-30 ENCOUNTER — Encounter
Admission: RE | Disposition: A | Discharge status: Home / Self Care | Payer: Self-pay | Source: Home / Self Care | Attending: Gastroenterology

## 2018-08-30 ENCOUNTER — Ambulatory Visit
Admission: RE | Admit: 2018-08-30 | Discharge: 2018-08-30 | Disposition: A | Discharge status: Home / Self Care | Payer: Medicare Other | Attending: Gastroenterology | Admitting: Gastroenterology

## 2018-08-30 ENCOUNTER — Encounter: Payer: Self-pay | Admitting: *Deleted

## 2018-08-30 ENCOUNTER — Ambulatory Visit: Payer: Medicare Other | Admitting: Anesthesiology

## 2018-08-30 DIAGNOSIS — Z86718 Personal history of other venous thrombosis and embolism: Secondary | ICD-10-CM | POA: Diagnosis not present

## 2018-08-30 DIAGNOSIS — E559 Vitamin D deficiency, unspecified: Secondary | ICD-10-CM | POA: Insufficient documentation

## 2018-08-30 DIAGNOSIS — Z886 Allergy status to analgesic agent status: Secondary | ICD-10-CM | POA: Insufficient documentation

## 2018-08-30 DIAGNOSIS — D123 Benign neoplasm of transverse colon: Secondary | ICD-10-CM | POA: Diagnosis not present

## 2018-08-30 DIAGNOSIS — N401 Enlarged prostate with lower urinary tract symptoms: Secondary | ICD-10-CM | POA: Diagnosis not present

## 2018-08-30 DIAGNOSIS — K5730 Diverticulosis of large intestine without perforation or abscess without bleeding: Secondary | ICD-10-CM | POA: Diagnosis not present

## 2018-08-30 DIAGNOSIS — Z79899 Other long term (current) drug therapy: Secondary | ICD-10-CM | POA: Insufficient documentation

## 2018-08-30 DIAGNOSIS — Z7901 Long term (current) use of anticoagulants: Secondary | ICD-10-CM | POA: Diagnosis not present

## 2018-08-30 DIAGNOSIS — Z8673 Personal history of transient ischemic attack (TIA), and cerebral infarction without residual deficits: Secondary | ICD-10-CM | POA: Insufficient documentation

## 2018-08-30 DIAGNOSIS — Z86010 Personal history of colonic polyps: Secondary | ICD-10-CM | POA: Insufficient documentation

## 2018-08-30 DIAGNOSIS — E785 Hyperlipidemia, unspecified: Secondary | ICD-10-CM | POA: Insufficient documentation

## 2018-08-30 DIAGNOSIS — N183 Chronic kidney disease, stage 3 (moderate): Secondary | ICD-10-CM | POA: Insufficient documentation

## 2018-08-30 DIAGNOSIS — N529 Male erectile dysfunction, unspecified: Secondary | ICD-10-CM | POA: Insufficient documentation

## 2018-08-30 DIAGNOSIS — I129 Hypertensive chronic kidney disease with stage 1 through stage 4 chronic kidney disease, or unspecified chronic kidney disease: Secondary | ICD-10-CM | POA: Diagnosis not present

## 2018-08-30 HISTORY — DX: Benign neoplasm of colon, unspecified: D12.6

## 2018-08-30 HISTORY — DX: Retention of urine, unspecified: R33.9

## 2018-08-30 HISTORY — PX: COLONOSCOPY WITH PROPOFOL: SHX5780

## 2018-08-30 HISTORY — DX: Chronic kidney disease, unspecified: N18.9

## 2018-08-30 HISTORY — DX: Gastric ulcer, unspecified as acute or chronic, without hemorrhage or perforation: K25.9

## 2018-08-30 HISTORY — DX: Unspecified atrial fibrillation: I48.91

## 2018-08-30 HISTORY — DX: Vitamin D deficiency, unspecified: E55.9

## 2018-08-30 HISTORY — DX: Cardiac arrhythmia, unspecified: I49.9

## 2018-08-30 LAB — PROTIME-INR
INR: 1.06
Prothrombin Time: 13.7 seconds (ref 11.4–15.2)

## 2018-08-30 SURGERY — COLONOSCOPY WITH PROPOFOL
Anesthesia: General

## 2018-08-30 MED ORDER — PROPOFOL 500 MG/50ML IV EMUL
INTRAVENOUS | Status: DC | PRN
  Administered 2018-08-30: 100 ug/kg/min via INTRAVENOUS

## 2018-08-30 MED ORDER — LIDOCAINE HCL (CARDIAC) PF 100 MG/5ML IV SOSY
PREFILLED_SYRINGE | INTRAVENOUS | Status: DC | PRN
  Administered 2018-08-30: 60 mg via INTRATRACHEAL

## 2018-08-30 MED ORDER — PROPOFOL 10 MG/ML IV BOLUS
INTRAVENOUS | Status: DC | PRN
  Administered 2018-08-30: 50 mg via INTRAVENOUS
  Administered 2018-08-30: 40 mg via INTRAVENOUS

## 2018-08-30 MED ORDER — PHENYLEPHRINE HCL 10 MG/ML IJ SOLN
INTRAMUSCULAR | Status: DC | PRN
  Administered 2018-08-30 (×4): 100 ug via INTRAVENOUS

## 2018-08-30 MED ORDER — FENTANYL CITRATE (PF) 100 MCG/2ML IJ SOLN
INTRAMUSCULAR | Status: DC | PRN
  Administered 2018-08-30: 50 ug via INTRAVENOUS
  Administered 2018-08-30: 25 ug via INTRAVENOUS

## 2018-08-30 MED ORDER — PROPOFOL 10 MG/ML IV BOLUS
INTRAVENOUS | Status: AC
  Filled 2018-08-30: qty 40

## 2018-08-30 MED ORDER — LIDOCAINE HCL (PF) 2 % IJ SOLN
INTRAMUSCULAR | Status: AC
  Filled 2018-08-30: qty 10

## 2018-08-30 MED ORDER — FENTANYL CITRATE (PF) 100 MCG/2ML IJ SOLN
INTRAMUSCULAR | Status: AC
  Filled 2018-08-30: qty 2

## 2018-08-30 MED ORDER — SODIUM CHLORIDE 0.9 % IV SOLN
INTRAVENOUS | Status: DC
  Administered 2018-08-30: 09:00:00 via INTRAVENOUS

## 2018-08-30 MED ORDER — SODIUM CHLORIDE 0.9 % IV SOLN: INTRAVENOUS | Status: DC

## 2018-08-30 NOTE — Anesthesia Postprocedure Evaluation (Signed)
Anesthesia Post Note  Patient: Dominic Brooks.  Procedure(s) Performed: COLONOSCOPY WITH PROPOFOL (N/A )  Patient location during evaluation: Endoscopy Anesthesia Type: General Level of consciousness: awake and alert Pain management: pain level controlled Vital Signs Assessment: post-procedure vital signs reviewed and stable Respiratory status: spontaneous breathing, nonlabored ventilation, respiratory function stable and patient connected to nasal cannula oxygen Cardiovascular status: blood pressure returned to baseline and stable Postop Assessment: no apparent nausea or vomiting Anesthetic complications: no     Last Vitals:  Vitals:   08/30/18 1134 08/30/18 1154  BP:  132/82  Pulse:    Resp: 19   Temp:    SpO2:      Last Pain:  Vitals:   08/30/18 1154  TempSrc:   PainSc: 0-No pain                 Rami Waddle S

## 2018-08-30 NOTE — H&P (Signed)
Outpatient short stay form Pre-procedure 08/30/2018 10:24 AM Dominic Sails MD  Primary Physician: Dr Tracie Harrier  Reason for visit: Colonoscopy  History of present illness: Patient is a 68 year old male presenting today for colonoscopy.  He has a personal history of adenomatous colon polyps with his last procedure being done 03/26/2015.  He tolerated his prep well.  He does take Coumadin has been held for 5 days.  His pro time today is 13.7 INR of 1.06.  Takes no other blood thinning agent or aspirin product.    Current Facility-Administered Medications:  .  0.9 %  sodium chloride infusion, , Intravenous, Continuous, Dominic Sails, MD .  0.9 %  sodium chloride infusion, , Intravenous, Continuous, Dominic Sails, MD, Last Rate: 20 mL/hr at 08/30/18 0919  Medications Prior to Admission  Medication Sig Dispense Refill Last Dose  . sildenafil (VIAGRA) 100 MG tablet Take 1 tablet (100 mg total) by mouth daily as needed for erectile dysfunction. 10 tablet 12 Past Month at Unknown time  . sildenafil (VIAGRA) 100 MG tablet Take 1 tablet (100 mg total) by mouth daily as needed for erectile dysfunction. Take two hours prior to intercourse on an empty stomach 6 tablet 12 Past Month at Unknown time  . Cholecalciferol 1000 UNITS tablet Take by mouth.   08/28/2018  . finasteride (PROSCAR) 5 MG tablet Take 1 tablet (5 mg total) by mouth daily. 90 tablet 3 08/28/2018  . simvastatin (ZOCOR) 40 MG tablet Take 40 mg by mouth daily.   08/28/2018  . verapamil (VERELAN PM) 240 MG 24 hr capsule Take 240 mg by mouth at bedtime.   08/28/2018  . warfarin (COUMADIN) 2 MG tablet Take 2 mg by mouth daily.   08/24/2018  . warfarin (COUMADIN) 4 MG tablet    08/24/2018     Allergies  Allergen Reactions  . Diona Fanti [Aspirin]      Past Medical History:  Diagnosis Date  . A-fib ([place])   . BPH (benign prostatic hyperplasia)   . Chronic kidney disease    stage 3  . Colon adenoma   . Diverticulosis   .  Dysrhythmia   . Elevated PSA   . Erectile dysfunction   . Gastric ulcer   . History of DVT (deep vein thrombosis)   . History of stomach ulcers   . Hyperlipidemia   . Hyperprolactinemia ([place])   . Hypertension   . Seizures ([place])   . Stroke ([place])   . Tubular adenoma of colon   . Urinary retention   . Vitamin D deficiency     Review of systems:      Physical Exam    Heart and lungs: Regular rate and rhythm without rub or gallop, lungs are bilaterally clear.    HEENT: Normocephalic atraumatic eyes are anicteric    Other:    Pertinant exam for procedure: Soft nontender nondistended bowel sounds positive normoactive.  Mildly protuberant.    Planned proceedures: Colonoscopy and indicated procedures. I have discussed the risks benefits and complications of procedures to include not limited to bleeding, infection, perforation and the risk of sedation and the patient wishes to proceed.    Dominic Sails, MD Gastroenterology 08/30/2018  10:24 AM

## 2018-08-30 NOTE — Anesthesia Post-op Follow-up Note (Signed)
Anesthesia QCDR form completed.        

## 2018-08-30 NOTE — Op Note (Signed)
[facility] Gastroenterology Patient Name: Dominic Brooks Procedure Date: 08/30/2018 10:25 AM MRN: [id number] Account #: [id number] Date of Birth: 09-23-1950 Admit Type: Outpatient Age: 68 Room: [facility] ENDO ROOM 3 Gender: Male Note Status: Finalized Procedure:            Colonoscopy Indications:          Personal history of colonic polyps Providers:            Lollie Sails, MD Referring MD:         Caprice Renshaw MD (Referring MD) Medicines:            Monitored Anesthesia Care Complications:        No immediate complications. Procedure:            Pre-Anesthesia Assessment:                       - ASA Grade Assessment: III - A patient with severe                        systemic disease.                       After obtaining informed consent, the colonoscope was                        passed under direct vision. Throughout the procedure,                        the patient's blood pressure, pulse, and oxygen                        saturations were monitored continuously. The                        Colonoscope was introduced through the anus and                        advanced to the the cecum, identified by appendiceal                        orifice and ileocecal valve. The colonoscopy was                        performed without difficulty. The patient tolerated the                        procedure well. The quality of the bowel preparation                        was good. Findings:      A 5 mm polyp was found in the transverse colon. The polyp was sessile.       The polyp was removed with a cold snare. Resection and retrieval were       complete.      Multiple medium-mouthed diverticula were found in the sigmoid colon and       descending colon.      The digital rectal exam was normal. Unable to retroflex due to poor air       retention. Impression:           - One 5 mm polyp in the  transverse colon, removed with                        a cold snare. Resected  and retrieved.                       - Diverticulosis in the sigmoid colon and in the                        descending colon. Recommendation:       - Discharge patient to home.                       - Await pathology results.                       - Telephone GI clinic for pathology results in 1 week.                       - Soft diet today, then advance as tolerated to advance                        diet as tolerated. Lollie Sails, MD 08/30/2018 11:22:18 AM This report has been signed electronically. Number of Addenda: 0 Note Initiated On: 08/30/2018 10:25 AM Scope Withdrawal Time: 0 hours 13 minutes 14 seconds  Total Procedure Duration: 0 hours 27 minutes 19 seconds       [facility]

## 2018-08-30 NOTE — Anesthesia Preprocedure Evaluation (Signed)
Anesthesia Evaluation  Patient identified by MRN, date of birth, ID band Patient awake    Reviewed: Allergy & Precautions, NPO status , Patient's Chart, lab work & pertinent test results, reviewed documented beta blocker date and time   Airway Mallampati: II  TM Distance: >3 FB     Dental  (+) Chipped   Pulmonary           Cardiovascular hypertension, Pt. on medications      Neuro/Psych Seizures -,  CVA, No Residual Symptoms    GI/Hepatic PUD,   Endo/Other    Renal/GU Renal disease     Musculoskeletal   Abdominal   Peds  Hematology   Anesthesia Other Findings Denies AFib.  Reproductive/Obstetrics                             Anesthesia Physical Anesthesia Plan  ASA: III  Anesthesia Plan: General   Post-op Pain Management:    Induction: Intravenous  PONV Risk Score and Plan:   Airway Management Planned:   Additional Equipment:   Intra-op Plan:   Post-operative Plan:   Informed Consent: I have reviewed the patients History and Physical, chart, labs and discussed the procedure including the risks, benefits and alternatives for the proposed anesthesia with the patient or authorized representative who has indicated his/her understanding and acceptance.     Plan Discussed with: CRNA  Anesthesia Plan Comments:         Anesthesia Quick Evaluation

## 2018-08-30 NOTE — Transfer of Care (Signed)
Immediate Anesthesia Transfer of Care Note  Patient: Dominic Brooks.  Procedure(s) Performed: COLONOSCOPY WITH PROPOFOL (N/A )  Patient Location: Endoscopy Unit  Anesthesia Type:General  Level of Consciousness: drowsy  Airway & Oxygen Therapy: Patient Spontanous Breathing and Patient connected to nasal cannula oxygen  Post-op Assessment: Report given to RN and Post -op Vital signs reviewed and stable  Post vital signs: stable  Last Vitals:  Vitals Value Taken Time  BP 100/68 08/30/2018 11:24 AM  Temp 36.1 C 08/30/2018 11:24 AM  Pulse 83 08/30/2018 11:26 AM  Resp 24 08/30/2018 11:26 AM  SpO2 100 % 08/30/2018 11:26 AM  Vitals shown include unvalidated device data.  Last Pain:  Vitals:   08/30/18 1124  TempSrc: Tympanic  PainSc: Asleep         Complications: No apparent anesthesia complications

## 2018-08-31 LAB — SURGICAL PATHOLOGY

## 2018-11-08 ENCOUNTER — Other Ambulatory Visit: Payer: Self-pay

## 2018-11-08 ENCOUNTER — Other Ambulatory Visit: Payer: Medicare Other

## 2018-11-08 DIAGNOSIS — N138 Other obstructive and reflux uropathy: Secondary | ICD-10-CM

## 2018-11-08 DIAGNOSIS — N401 Enlarged prostate with lower urinary tract symptoms: Principal | ICD-10-CM

## 2018-11-08 NOTE — Progress Notes (Signed)
11/10/2018 10:50 AM   Dominic Brooks. 09-11-1950 [id number]  Referring provider: Derinda Late, MD [phone] [facility] [place], [place] [address]  Chief Complaint  Patient presents with  . Follow-up    HPI: Dominic Brooks. is a 68 y.o. male African American with erectile dysfunction and BPH with LUTS who presents today for a 6 month follow-up, accompanied by his son, Merrily Pew.  Erectile dysfunction His SHIM score is 19, which is mild ED.   His previous SHIM score was 12.  He has been having difficulty with erections for the last several years.  His major complaint is maintaining an erection.  His libido is preserved.   His risk factors for ED are age, BPH, HLD, and CVA.  He denies any painful erections or curvatures with his erections.  He is no longer having spontaneous erections.  He has tried Viagra in the past with satisfactory results.  SHIM    Row Name 11/10/18 1036         SHIM: Over the last 6 months:   How do you rate your confidence that you could get and keep an erection?  Moderate     When you had erections with sexual stimulation, how often were your erections hard enough for penetration (entering your partner)?  Most Times (much more than half the time)     During sexual intercourse, how often were you able to maintain your erection after you had penetrated (entered) your partner?  Most Times (much more than half the time)     During sexual intercourse, how difficult was it to maintain your erection to completion of intercourse?  Slightly Difficult     When you attempted sexual intercourse, how often was it satisfactory for you?  Most Times (much more than half the time)       SHIM Total Score   SHIM  19       Score: 1-7 Severe ED 8-11 Moderate ED 12-16 Mild-Moderate ED 17-21 Mild ED 22-25 No ED  BPH WITH LUTS  (prostate and/or bladder) IPSS score: 5/2  Previous score: 3/2  Major complaint(s): Nocturia x  3.  Denies any dysuria, hematuria or suprapubic pain.   Currently taking: Finasteride.  Today (11/10/2018), he reports he is doing well.  He notes that the nocturia is related to drinking sweet tea.  Denies any recent fevers, chills, nausea or vomiting.  He does not have a family history of PCa.  IPSS    Row Name 11/10/18 1000         International Prostate Symptom Score   How often have you had the sensation of not emptying your bladder?  Less than 1 in 5     How often have you had to urinate less than every two hours?  Less than 1 in 5 times     How often have you found you stopped and started again several times when you urinated?  Not at All     How often have you found it difficult to postpone urination?  Not at All     How often have you had a weak urinary stream?  Not at All     How often have you had to strain to start urination?  Not at All     How many times did you typically get up at night to urinate?  3 Times     Total IPSS Score  5  Quality of Life due to urinary symptoms   If you were to spend the rest of your life with your urinary condition just the way it is now how would you feel about that?  Mostly Satisfied        Score:  1-7 Mild 8-19 Moderate 20-35 Severe  History of increase in PSA velocity PSA trend  2.6 on  08/30/2014  3.1 on 10/15/2015  3.7 on 04/16/2016  2.6 on 05/21/2016  3.3 on 10/13/2016  1.1 on 04/01/2017  1.38 on 05/29/2017  2.1 on 02/01/2018  0.9 on 05/10/2018  1.1 on 11/08/2018  PMH: Past Medical History:  Diagnosis Date  . A-fib ([place])   . BPH (benign prostatic hyperplasia)   . Chronic kidney disease    stage 3  . Colon adenoma   . Diverticulosis   . Dysrhythmia   . Elevated PSA   . Erectile dysfunction   . Gastric ulcer   . History of DVT (deep vein thrombosis)   . History of stomach ulcers   . Hyperlipidemia   . Hyperprolactinemia ([place])   . Hypertension   . Seizures ([place])   . Stroke ([place])   . Tubular  adenoma of colon   . Urinary retention   . Vitamin D deficiency     Surgical History: Past Surgical History:  Procedure Laterality Date  . COLONOSCOPY    . COLONOSCOPY WITH PROPOFOL N/A 03/26/2015   Procedure: COLONOSCOPY WITH PROPOFOL;  Surgeon: Lollie Sails, MD;  Location: [facility] ENDOSCOPY;  Service: Endoscopy;  Laterality: N/A;  . COLONOSCOPY WITH PROPOFOL N/A 08/30/2018   Procedure: COLONOSCOPY WITH PROPOFOL;  Surgeon: Lollie Sails, MD;  Location[facility] ENDOSCOPY;  Service: Endoscopy;  Laterality: N/A;  . SHOULDER SURGERY  1991  . thumb surgery  1984    Home Medications:  Allergies as of 11/10/2018      Reactions   Asa [aspirin]       Medication List       Accurate as of November 10, 2018 10:50 AM. Always use your most recent med list.        Cholecalciferol 25 MCG (1000 UT) tablet Take by mouth.   finasteride 5 MG tablet Commonly known as:  PROSCAR Take 1 tablet (5 mg total) by mouth daily.   sildenafil 100 MG tablet Commonly known as:  VIAGRA Take 1 tablet (100 mg total) by mouth daily as needed for erectile dysfunction. Take two hours prior to intercourse on an empty stomach   sildenafil 100 MG tablet Commonly known as:  VIAGRA Take 1 tablet (100 mg total) by mouth daily as needed for erectile dysfunction.   simvastatin 40 MG tablet Commonly known as:  ZOCOR Take 40 mg by mouth daily.   verapamil 240 MG 24 hr capsule Commonly known as:  VERELAN PM Take 240 mg by mouth at bedtime.   warfarin 2 MG tablet Commonly known as:  COUMADIN Take 2 mg by mouth daily.   warfarin 4 MG tablet Commonly known as:  COUMADIN       Allergies:  Allergies  Allergen Reactions  . Asa [Aspirin]     Family History: Family History  Problem Relation Age of Onset  . Diabetes Mother   . Hypertension Mother   . Heart disease Father   . Hypertension Father   . Kidney disease Neg Hx   . Prostate cancer Neg Hx     Social History:  reports that he has never  smoked. He has never used smokeless  tobacco. He reports previous alcohol use. He reports that he does not use drugs.  ROS: UROLOGY Frequent Urination?: No Hard to postpone urination?: No Burning/pain with urination?: No Get up at night to urinate?: No Leakage of urine?: No Urine stream starts and stops?: No Trouble starting stream?: No Do you have to strain to urinate?: No Blood in urine?: No Urinary tract infection?: No Sexually transmitted disease?: No Injury to kidneys or bladder?: No Painful intercourse?: No Weak stream?: No Erection problems?: No Penile pain?: No  Gastrointestinal Nausea?: No Vomiting?: No Indigestion/heartburn?: No Diarrhea?: No Constipation?: No  Constitutional Fever: No Night sweats?: No Weight loss?: No Fatigue?: No  Skin Skin rash/lesions?: No Itching?: No  Eyes Blurred vision?: No Double vision?: No  Ears/Nose/Throat Sore throat?: No Sinus problems?: No  Hematologic/Lymphatic Swollen glands?: No Easy bruising?: No  Cardiovascular Leg swelling?: No Chest pain?: No  Respiratory Cough?: No Shortness of breath?: No  Endocrine Excessive thirst?: No  Musculoskeletal Back pain?: No Joint pain?: No  Neurological Headaches?: No Dizziness?: No  Psychologic Depression?: No Anxiety?: No  Physical Exam: BP 122/77   Pulse 64   Ht 5\' 3"  (1.6 m)   Wt 185 lb (83.9 kg)   BMI 32.77 kg/m   Constitutional:  Well nourished. Alert and oriented, No acute distress. HEENT: [place] AT, moist mucus membranes.  Trachea midline. Cardiovascular: No clubbing, cyanosis, or edema. Respiratory: Normal respiratory effort, no increased work of breathing. GU: No CVA tenderness.  No bladder fullness or masses.  Patient with uncircumcised phallus.  Foreskin easily retracted.  Urethral meatus is patent.  No penile discharge. No penile lesions or rashes. Scrotum without lesions, cysts, rashes and/or edema.  Testicles are located scrotally bilaterally.  No masses are appreciated in the testicles. Left and right epididymis are normal. Rectal: Patient with normal sphincter tone.  Anus and perineum without scarring or rashes.  No rectal masses are appreciated. Prostate is approximately 60 grams, only the apex and midportion could be palpated, no nodules are appreciated.  Seminal vesicles could not be palpated. Skin: No rashes, bruises or suspicious lesions. Lymph: No inguinal adenopathy. Neurologic: Grossly intact, no focal deficits, moving all 4 extremities. Psychiatric: Normal mood and affect.  Laboratory Data:  PSA History See above     Lab Results  Component Value Date   TESTOSTERONE 522 10/18/2014   Lab Results  Component Value Date   TSH 1.72 10/18/2014   I have reviewed the labs.   Assessment & Plan:    1.  BPH with LUTS - IPSS score is 5/2, it is worsening - Continue conservative management, avoiding bladder irritants and timed voiding's - Continue finasteride 5 mg daily; refills given - RTC in 6 months for I PSS, PSA and exam  2. Erectile dysfunction - SHIM score is 19, it is improving - Continue Viagra: refills given  - RTC in 6 months for repeat SHIM score and exam   3. History of increase in PSA velocity - PSA has returned to baseline  4. Nocturia - Had improved with fluid restriction  - Patient's worsening nocturia related to drinking sweet tea; patient is insufficiently bothered by nocturia to stop drinking it  Return in about 6 months (around 05/13/2019) for IPSS, SHIM, PSA and exam.  Zara Council, PA-C  [address] [place], [place] [address] [phone]  I, Adele Schilder, am acting as a Education administrator for [employer], PA-C.   I have reviewed the above documentation for accuracy  and completeness, and I agree with the above.    Zara Council, PA-C

## 2018-11-09 LAB — PSA: Prostate Specific Ag, Serum: 1.1 ng/mL (ref 0.0–4.0)

## 2018-11-10 ENCOUNTER — Other Ambulatory Visit: Payer: Self-pay

## 2018-11-10 ENCOUNTER — Ambulatory Visit (INDEPENDENT_AMBULATORY_CARE_PROVIDER_SITE_OTHER): Payer: Medicare Other | Admitting: Urology

## 2018-11-10 ENCOUNTER — Encounter: Payer: Self-pay | Admitting: Urology

## 2018-11-10 DIAGNOSIS — N529 Male erectile dysfunction, unspecified: Secondary | ICD-10-CM | POA: Diagnosis not present

## 2018-11-10 DIAGNOSIS — R972 Elevated prostate specific antigen [PSA]: Secondary | ICD-10-CM

## 2018-11-10 MED ORDER — SILDENAFIL CITRATE 100 MG PO TABS: 100.0000 mg | ORAL_TABLET | Freq: Every day | ORAL | 12 refills | Status: DC | PRN

## 2018-11-10 MED ORDER — FINASTERIDE 5 MG PO TABS: 5.0000 mg | ORAL_TABLET | Freq: Every day | ORAL | 3 refills | Status: DC

## 2019-05-06 ENCOUNTER — Other Ambulatory Visit: Payer: Medicare Other

## 2019-05-11 ENCOUNTER — Ambulatory Visit: Payer: Medicare Other | Admitting: Urology

## 2020-02-01 ENCOUNTER — Other Ambulatory Visit: Payer: Self-pay | Admitting: Urology

## 2020-02-01 DIAGNOSIS — N529 Male erectile dysfunction, unspecified: Secondary | ICD-10-CM

## 2020-03-09 ENCOUNTER — Ambulatory Visit: Payer: Medicare Other | Admitting: Urology

## 2020-03-13 NOTE — Progress Notes (Signed)
11/10/2018 10:48 AM   Dominic Brooks. [birth year] [id number]  Referring provider: Derinda Late, MD [phone] [address] [facility] [facility] [place],  [place] [address]  Chief Complaint  Patient presents with  . Medication Refill    HPI: Dominic Brooks. is a 69 y.o. male with erectile dysfunction and BPH with LUTS who presents today for follow up.    Erectile dysfunction His SHIM score is 18, which is mild ED.   His previous SHIM score was 19.  He has been having difficulty with erections for the last several years.  His major complaint is maintaining an erection.  His libido is preserved.   His risk factors for ED are age, BPH, HLD, and CVA.  He denies any painful erections or curvatures with his erections.  He is having an occasional spontaneous erections.  He has tried Viagra in the past with satisfactory results.   SHIM    Row Name 03/14/20 1020         SHIM: Over the last 6 months:   How do you rate your confidence that you could get and keep an erection? Moderate     When you had erections with sexual stimulation, how often were your erections hard enough for penetration (entering your partner)? Most Times (much more than half the time)     During sexual intercourse, how often were you able to maintain your erection after you had penetrated (entered) your partner? Sometimes (about half the time)     During sexual intercourse, how difficult was it to maintain your erection to completion of intercourse? Slightly Difficult     When you attempted sexual intercourse, how often was it satisfactory for you? Most Times (much more than half the time)       SHIM Total Score   SHIM 18           Score: 1-7 Severe ED 8-11 Moderate ED 12-16 Mild-Moderate ED 17-21 Mild ED 22-25 No ED  BPH WITH LUTS  (prostate and/or bladder) IPSS score: 13/2   PVR: 111 mL  Previous score: 5/2  Previous PVR: 53 mL  Major complaint(s):  Night time incontinence.  Wakes up  with pants wet.  This has been going on for the last few months.  He does admit that he consumes Pepsi and sweet tea frequently enies any dysuria, hematuria or suprapubic pain.   Currently taking: finasteride 5 mg daily  Denies any recent fevers, chills, nausea or vomiting.  He does not have a family history of PCa.   IPSS    Row Name 03/14/20 1000         International Prostate Symptom Score   How often have you had the sensation of not emptying your bladder? Less than 1 in 5     How often have you had to urinate less than every two hours? Less than 1 in 5 times     How often have you found you stopped and started again several times when you urinated? Not at All     How often have you found it difficult to postpone urination? Almost always     How often have you had a weak urinary stream? Less than half the time     How often have you had to strain to start urination? Not at All     How many times did you typically get up at night to urinate? 4 Times  Total IPSS Score 13       Quality of Life due to urinary symptoms   If you were to spend the rest of your life with your urinary condition just the way it is now how would you feel about that? Mostly Satisfied            Score:  1-7 Mild 8-19 Moderate 20-35 Severe   History of increase in PSA velocity PSA trend  2.6 on  08/30/2014  Component     Latest Ref Rng & Units 10/15/2015 04/16/2016 05/21/2016 10/13/2016  Prostate Specific Ag, Serum     0.0 - 4.0 ng/mL 3.1 3.7 2.6 3.3   Component     Latest Ref Rng & Units 04/01/2017 02/01/2018 05/10/2018 11/08/2018  Prostate Specific Ag, Serum     0.0 - 4.0 ng/mL 1.1 2.1 0.9 1.1   PMH: Past Medical History:  Diagnosis Date  . A-fib ([place])   . BPH (benign prostatic hyperplasia)   . Chronic kidney disease    stage 3  . Colon adenoma   . Diverticulosis   . Dysrhythmia   . Elevated PSA   . Erectile dysfunction   . Gastric ulcer   . History of DVT (deep vein thrombosis)   .  History of stomach ulcers   . Hyperlipidemia   . Hyperprolactinemia ([place])   . Hypertension   . Seizures ([place])   . Stroke ([place])   . Tubular adenoma of colon   . Urinary retention   . Vitamin D deficiency     Surgical History: Past Surgical History:  Procedure Laterality Date  . COLONOSCOPY    . COLONOSCOPY WITH PROPOFOL N/A 03/26/2015   Procedure: COLONOSCOPY WITH PROPOFOL;  Surgeon: Lollie Sails, MD;  Location: [facility] END[facility]COPY;  Service: Endoscopy;  Laterality: N/A;  . COLONOSCOPY WITH PROPOFOL N/A 08/30/2018   Procedure: COLONOSCOPY WITH PROPOFOL;  Surgeon: Lollie Sails, MD;  Location: [facility] ENDOSCOPY;  Service: Endoscopy;  Laterality: N/A;  . SHOULDER SURGERY  1991  . thumb surgery  1984    Home Medications:  Allergies as of 03/14/2020      Reactions   Asa [aspirin]       Medication List       Accurate as of March 14, 2020 10:48 AM. If you have any questions, ask your nurse or doctor.        Cholecalciferol 25 MCG (1000 UT) tablet Take by mouth.   finasteride 5 MG tablet Commonly known as: PROSCAR Take 1 tablet (5 mg total) by mouth daily.   sildenafil 100 MG tablet Commonly known as: VIAGRA Take 1 tablet (100 mg total) by mouth daily as needed for erectile dysfunction. Take two hours prior to intercourse on an empty stomach   sildenafil 100 MG tablet Commonly known as: VIAGRA Take 1 tablet (100 mg total) by mouth daily as needed for erectile dysfunction.   simvastatin 40 MG tablet Commonly known as: ZOCOR Take 40 mg by mouth daily.   tamsulosin 0.4 MG Caps capsule Commonly known as: FLOMAX Take 1 capsule (0.4 mg total) by mouth daily. Started by: Zara Council, PA-C   verapamil 240 MG 24 hr capsule Commonly known as: VERELAN PM Take 240 mg by mouth at bedtime.   warfarin 2 MG tablet Commonly known as: COUMADIN Take 2 mg by mouth daily.   warfarin 4 MG tablet Commonly known as: COUMADIN       Allergies:  Allergies  Allergen Reactions    . Asa [Aspirin]  Family History: Family History  Problem Relation Age of Onset  . Diabetes Mother   . Hypertension Mother   . Heart disease Father   . Hypertension Father   . Kidney disease Neg Hx   . Prostate cancer Neg Hx     Social History:  reports that he has never smoked. He has never used smokeless tobacco. He reports previous alcohol use. He reports that he does not use drugs.  ROS: For pertinent review of systems please refer to history of present illness  Physical Exam: BP 128/82 (BP Location: Left Arm, Patient Position: Sitting, Cuff Size: Normal)   Pulse 69   Ht 5\' 3"  (1.6 m)   Wt 176 lb 4.8 oz (80 kg)   BMI 31.23 kg/m   Constitutional:  Well nourished. Alert and oriented, No acute distress. HEENT: [place] AT, mask in place.  Trachea midline Cardiovascular: No clubbing, cyanosis, or edema. Respiratory: Normal respiratory effort, no increased work of breathing. GI: Abdomen is soft, non tender, non distended, no abdominal masses. Liver and spleen not palpable.  No hernias appreciated.  Stool sample for occult testing is not indicated.   GU: No CVA tenderness.  No bladder fullness or masses.  Patient with uncircumcised phallus. Foreskin easily retracted  Urethral meatus is patent.  No penile discharge. No penile lesions or rashes. Scrotum without lesions, cysts, rashes and/or edema.  Testicles are located scrotally bilaterally. No masses are appreciated in the testicles. Left and right epididymis are normal. Rectal: Patient with  normal sphincter tone. Anus and perineum without scarring or rashes. No rectal masses are appreciated. Prostate is approximately 50 grams, no nodules are appreciated. Seminal vesicles could not be palpated Skin: No rashes, bruises or suspicious lesions. Lymph: No inguinal adenopathy. Neurologic: Grossly intact, no focal deficits, moving all 4 extremities. Psychiatric: Normal mood and affect.  Laboratory Data:  PSA History See  above     Lab Results  Component Value Date   TESTOSTERONE 522 10/18/2014   Lab Results  Component Value Date   TSH 1.72 10/18/2014   I have reviewed the labs.   Assessment & Plan:    1.  BPH with LUTS - IPSS score is 13/2, it is worsening - Continue conservative management, avoiding bladder irritants and timed voiding's - Continue finasteride 5 mg daily; refills given - Add tamsulosin 0.4 mg daily - RTC in 1 month for I PSS and PVR  2. Erectile dysfunction - SHIM score is 18, it is improving - Continue Viagra: refills given  - RTC in 6 months for repeat SHIM score and exam   3. History of increase in PSA velocity - PSA pending  4. Nocturia - Patient's worsening nocturia related to drinking sweet tea and incomplete emptying - Will try adding tamsulosin and switching to water  Return in about 1 month (around 04/14/2020) for IPSS and PVR.  Zara Council, PA-C  [facility] [address] [place], [place] [address] [phone]

## 2020-03-14 ENCOUNTER — Ambulatory Visit (INDEPENDENT_AMBULATORY_CARE_PROVIDER_SITE_OTHER): Payer: Medicare Other | Admitting: Urology

## 2020-03-14 ENCOUNTER — Encounter: Payer: Self-pay | Admitting: Urology

## 2020-03-14 ENCOUNTER — Other Ambulatory Visit: Payer: Self-pay

## 2020-03-14 VITALS — BP 128/82 | HR 69 | Ht 63.0 in | Wt 176.3 lb

## 2020-03-14 DIAGNOSIS — R351 Nocturia: Secondary | ICD-10-CM

## 2020-03-14 DIAGNOSIS — N401 Enlarged prostate with lower urinary tract symptoms: Secondary | ICD-10-CM

## 2020-03-14 DIAGNOSIS — N529 Male erectile dysfunction, unspecified: Secondary | ICD-10-CM

## 2020-03-14 DIAGNOSIS — N138 Other obstructive and reflux uropathy: Secondary | ICD-10-CM

## 2020-03-14 DIAGNOSIS — R972 Elevated prostate specific antigen [PSA]: Secondary | ICD-10-CM | POA: Diagnosis not present

## 2020-03-14 LAB — BLADDER SCAN AMB NON-IMAGING

## 2020-03-14 MED ORDER — FINASTERIDE 5 MG PO TABS: 5.0000 mg | ORAL_TABLET | Freq: Every day | ORAL | 3 refills | Status: DC

## 2020-03-14 MED ORDER — TAMSULOSIN HCL 0.4 MG PO CAPS: 0.4000 mg | ORAL_CAPSULE | Freq: Every day | ORAL | 3 refills | Status: DC

## 2020-03-14 MED ORDER — SILDENAFIL CITRATE 100 MG PO TABS: 100.0000 mg | ORAL_TABLET | Freq: Every day | ORAL | 12 refills | Status: DC | PRN

## 2020-03-15 ENCOUNTER — Telehealth: Payer: Self-pay | Admitting: Family Medicine

## 2020-03-15 LAB — PSA: Prostate Specific Ag, Serum: 2.4 ng/mL (ref 0.0–4.0)

## 2020-03-15 NOTE — Telephone Encounter (Signed)
LMOM for patient to return call.

## 2020-03-15 NOTE — Telephone Encounter (Signed)
-----   Message from Nori Riis, PA-C sent at 03/15/2020  8:13 AM EDT ----- Please let Mr. Seve know that his PSA has increased.  Has he been taking his finasteride (Proscar) every day?

## 2020-03-19 NOTE — Telephone Encounter (Signed)
Would you call him and advise him to restart the finasteride?  This would likely explain the increase in his PSA.

## 2020-03-19 NOTE — Telephone Encounter (Signed)
Spoke to patient, he states he has been out of the medication. [facility] is [facility] on [facility] to send the medication that was sent on 03/14/20.

## 2020-03-19 NOTE — Telephone Encounter (Signed)
Patient hasn't been taking finasteride because he ran out. Refill may be sent to [facility].

## 2020-03-27 ENCOUNTER — Telehealth: Payer: Self-pay

## 2020-03-27 DIAGNOSIS — R972 Elevated prostate specific antigen [PSA]: Secondary | ICD-10-CM

## 2020-03-27 MED ORDER — FINASTERIDE 5 MG PO TABS: 5.0000 mg | ORAL_TABLET | Freq: Every day | ORAL | 3 refills | Status: DC

## 2020-03-27 NOTE — Telephone Encounter (Signed)
Pt left message with after hours nurse stating that RX for Finasteride was sent to [facility] but [facility]. [facility].

## 2020-04-10 NOTE — Progress Notes (Signed)
11/10/2018 10:21 AM   Dominic Brooks. [birth year] [id number]  Referring provider: Derinda Late, MD [phone] [address] [place] [facility] [place],  [place] [address]  Chief Complaint  Patient presents with  . Benign Prostatic Hypertrophy    68mo w/PVR    HPI: Dominic Brooks. is a 69 y.o. male with erectile dysfunction and BPH with LUTS who presents today for follow up after a trial of tamsulosin.    BPH WITH LUTS  (prostate and/or bladder) IPSS score: 7/2  PVR: 62  mL  Previous score: 13/2  Previous PVR: 111 mL  Major complaint(s):  Night time incontinence has abated, but he continues to have nocturia x 3.  He will lessen this with reducing fluids before bedtime.  Patient denies any modifying or aggravating factors.  Patient denies any gross hematuria, dysuria or suprapubic/flank pain.  Patient denies any fevers, chills, nausea or vomiting.    Currently taking: finasteride 5 mg daily.  Clarified patients medications and he was not taking the finasteride when he saw me in July and he never received the tamsulosin or sildenafil.    He does not have a family history of PCa.   IPSS    Row Name 04/11/20 0900         International Prostate Symptom Score   How often have you had the sensation of not emptying your bladder? Less than half the time     How often have you had to urinate less than every two hours? Less than half the time     How often have you found you stopped and started again several times when you urinated? Not at All     How often have you found it difficult to postpone urination? Not at All     How often have you had a weak urinary stream? Not at All     How often have you had to strain to start urination? Not at All     How many times did you typically get up at night to urinate? 3 Times     Total IPSS Score 7       Quality of Life due to urinary symptoms   If you were to spend the rest of your life with your urinary condition just  the way it is now how would you feel about that? Mostly Satisfied            Score:  1-7 Mild 8-19 Moderate 20-35 Severe   PMH: Past Medical History:  Diagnosis Date  . A-fib ([place])   . BPH (benign prostatic hyperplasia)   . Chronic kidney disease    stage 3  . Colon adenoma   . Diverticulosis   . Dysrhythmia   . Elevated PSA   . Erectile dysfunction   . Gastric ulcer   . History of DVT (deep vein thrombosis)   . History of stomach ulcers   . Hyperlipidemia   . Hyperprolactinemia ([place])   . Hypertension   . Seizures ([place])   . Stroke ([place])   . Tubular adenoma of colon   . Urinary retention   . Vitamin D deficiency     Surgical History: Past Surgical History:  Procedure Laterality Date  . COLONOSCOPY    . COLONOSCOPY WITH PROPOFOL N/A 03/26/2015   Procedure: COLONOSCOPY WITH PROPOFOL;  Surgeon: Lollie Sails, MD;  Location: [facility] ENDOSCOPY;  Service: Endoscopy;  Laterality: N/A;  . COLONOSCOPY WITH PROPOFOL  N/A 08/30/2018   Procedure: COLONOSCOPY WITH PROPOFOL;  Surgeon: Lollie Sails, MD;  Location: [facility] ENDOSCOPY;  Service: Endoscopy;  Laterality: N/A;  . SHOULDER SURGERY  1991  . thumb surgery  1984    Home Medications:  Allergies as of 04/11/2020      Reactions   Asa [aspirin]       Medication List       Accurate as of April 11, 2020 10:21 AM. If you have any questions, ask your nurse or doctor.        Cholecalciferol 25 MCG (1000 UT) tablet Take by mouth.   finasteride 5 MG tablet Commonly known as: PROSCAR Take 1 tablet (5 mg total) by mouth daily.   sildenafil 100 MG tablet Commonly known as: VIAGRA Take 1 tablet (100 mg total) by mouth daily as needed for erectile dysfunction. Take two hours prior to intercourse on an empty stomach   sildenafil 100 MG tablet Commonly known as: VIAGRA Take 1 tablet (100 mg total) by mouth daily as needed for erectile dysfunction.   simvastatin 40 MG tablet Commonly known as: ZOCOR Take 40 mg by  mouth daily.   tamsulosin 0.4 MG Caps capsule Commonly known as: FLOMAX Take 1 capsule (0.4 mg total) by mouth daily.   verapamil 240 MG 24 hr capsule Commonly known as: VERELAN PM Take 240 mg by mouth at bedtime.   warfarin 2 MG tablet Commonly known as: COUMADIN Take 2 mg by mouth daily.   warfarin 4 MG tablet Commonly known as: COUMADIN       Allergies:  Allergies  Allergen Reactions  . Asa [Aspirin]     Family History: Family History  Problem Relation Age of Onset  . Diabetes Mother   . Hypertension Mother   . Heart disease Father   . Hypertension Father   . Kidney disease Neg Hx   . Prostate cancer Neg Hx     Social History:  reports that he has never smoked. He has never used smokeless tobacco. He reports previous alcohol use. He reports that he does not use drugs.  ROS: For pertinent review of systems please refer to history of present illness  Physical Exam: BP 114/70   Pulse 65   Ht 5\' 3"  (1.6 m)   Wt 173 lb (78.5 kg)   BMI 30.65 kg/m   Constitutional:  Well nourished. Alert and oriented, No acute distress. HEENT: [place] AT, mask in place.  Trachea midline Cardiovascular: No clubbing, cyanosis, or edema. Respiratory: Normal respiratory effort, no increased work of breathing. Neurologic: Grossly intact, no focal deficits, moving all 4 extremities. Psychiatric: Normal mood and affect.  Laboratory Data:  PSA History See above     Lab Results  Component Value Date   TESTOSTERONE 522 10/18/2014   Lab Results  Component Value Date   TSH 1.72 10/18/2014   I have reviewed the labs.   Assessment & Plan:    1.  BPH with LUTS - IPSS score is 7/2, it is improved - Continue conservative management, avoiding bladder irritants and timed voiding's - Restart finasteride 5 mg daily; refills given - Start tamsulosin 0.4 mg daily - RTC in 12 month for PSA, I PSS and exam - if PSA has reduced in 6 months  2. Increase in PSA velocity - PSA had doubled  over the last year, but he had not taken the finasteride for several weeks - repeat PSA in 6 months   Return in about 6 months (around 10/12/2020)  for PSA only .  Zara Council, PA-C  [facility] [address] [place], [place] [address] [phone]  I spent 25 minutes on the day of the encounter to include pre-visit record review, face-to-face time with the patient, and post-visit ordering of tests.

## 2020-04-11 ENCOUNTER — Other Ambulatory Visit: Payer: Self-pay

## 2020-04-11 ENCOUNTER — Ambulatory Visit (INDEPENDENT_AMBULATORY_CARE_PROVIDER_SITE_OTHER): Payer: Medicare Other | Admitting: Urology

## 2020-04-11 ENCOUNTER — Encounter: Payer: Self-pay | Admitting: Urology

## 2020-04-11 VITALS — BP 114/70 | HR 65 | Ht 63.0 in | Wt 173.0 lb

## 2020-04-11 DIAGNOSIS — N138 Other obstructive and reflux uropathy: Secondary | ICD-10-CM

## 2020-04-11 DIAGNOSIS — N529 Male erectile dysfunction, unspecified: Secondary | ICD-10-CM

## 2020-04-11 DIAGNOSIS — N401 Enlarged prostate with lower urinary tract symptoms: Secondary | ICD-10-CM

## 2020-04-11 LAB — BLADDER SCAN AMB NON-IMAGING

## 2020-04-11 MED ORDER — SILDENAFIL CITRATE 100 MG PO TABS: 100.0000 mg | ORAL_TABLET | Freq: Every day | ORAL | 12 refills | Status: DC | PRN

## 2020-04-11 MED ORDER — TAMSULOSIN HCL 0.4 MG PO CAPS: 0.4000 mg | ORAL_CAPSULE | Freq: Every day | ORAL | 3 refills | Status: DC

## 2020-10-15 ENCOUNTER — Other Ambulatory Visit: Payer: Self-pay | Admitting: Family Medicine

## 2020-10-15 DIAGNOSIS — N401 Enlarged prostate with lower urinary tract symptoms: Secondary | ICD-10-CM

## 2020-10-15 DIAGNOSIS — N138 Other obstructive and reflux uropathy: Secondary | ICD-10-CM

## 2020-10-17 ENCOUNTER — Other Ambulatory Visit: Payer: Medicare Other

## 2020-10-17 ENCOUNTER — Other Ambulatory Visit: Payer: Self-pay

## 2020-10-17 DIAGNOSIS — N401 Enlarged prostate with lower urinary tract symptoms: Secondary | ICD-10-CM

## 2020-10-18 ENCOUNTER — Telehealth: Payer: Self-pay | Admitting: Family Medicine

## 2020-10-18 LAB — PSA: Prostate Specific Ag, Serum: 1.6 ng/mL (ref 0.0–4.0)

## 2020-10-18 NOTE — Telephone Encounter (Signed)
Patient notified and voiced understanding. Appointments have been made.  

## 2020-10-18 NOTE — Telephone Encounter (Signed)
-----   Message from Nori Riis, PA-C sent at 10/18/2020  8:10 AM EST ----- Please let Dominic Brooks know that his PSA has reduced to 1.6.  I would like to see him in 6 months with PSA, I PSS and exam with PSA prior.

## 2020-10-23 NOTE — Progress Notes (Signed)
11/10/2018 11:38 AM   Dominic Brooks. Nov 04, 1950 [id number]  Referring provider: Derinda Late, MD [phone] [facility] [place],  [place] [address]  Chief Complaint  Patient presents with  . Benign Prostatic Hypertrophy   Urological history: 1. BPH with LU TS - 1.6 in 09/2020 - I PSS 12/2 - PVR 65 mL - managed with tamsulosin 0.4 mg daily and finasteride 5 mg daily  2. ED - SHIM 68 - contributing factors of age, BPH, HLD, and CVA - managed with sildenafil 100 mg, on-demand-dosing   HPI: Dominic Brooks. is a 70 y.o. male who presents today for a swollen testicle.    He has been experiencing swelling and pain in the testicles for the last 2 weeks.  He states he notices the swelling is worse as the day progresses and then is almost resolved when he wakes up in the morning, but the testicular pain sometimes keeps him up at night.    Scrotal ultrasound performed today indicates epididymitis/orchitis with no evidence of testicular torsion for which I was concerned due to the pain keeping him up at night.  He has feelings of incomplete bladder emptying and nocturia x3.  Patient denies any modifying or aggravating factors.  Patient denies any gross hematuria, dysuria or suprapubic/flank pain.  Patient denies any fevers, chills, nausea or vomiting.    IPSS    Row Name 10/24/20 1100         International Prostate Symptom Score   How often have you had the sensation of not emptying your bladder? Almost always     How often have you had to urinate less than every two hours? About half the time     How often have you found you stopped and started again several times when you urinated? Less than 1 in 5 times     How often have you found it difficult to postpone urination? Not at All     How often have you had a weak urinary stream? Not at All     How often have you had to strain to start urination? Not at All     How many times did you  typically get up at night to urinate? 3 Times     Total IPSS Score 12           Quality of Life due to urinary symptoms   If you were to spend the rest of your life with your urinary condition just the way it is now how would you feel about that? Mostly Satisfied            Score:  1-7 Mild 8-19 Moderate 20-35 Severe   Patient still having spontaneous erections.   He denies any pain or curvature with erections.    SHIM    Row Name 10/24/20 1157         SHIM: Over the last 6 months:   How do you rate your confidence that you could get and keep an erection? Moderate     When you had erections with sexual stimulation, how often were your erections hard enough for penetration (entering your partner)? Most Times (much more than half the time)     During sexual intercourse, how often were you able to maintain your erection after you had penetrated (entered) your partner? Most Times (much more than half the time)     During sexual intercourse, how difficult was it  to maintain your erection to completion of intercourse? Slightly Difficult     When you attempted sexual intercourse, how often was it satisfactory for you? Most Times (much more than half the time)           SHIM Total Score   SHIM 19            Score: 1-7 Severe ED 8-11 Moderate ED 12-16 Mild-Moderate ED 17-21 Mild ED 22-25 No ED   PMH: Past Medical History:  Diagnosis Date  . A-fib ([place])   . BPH (benign prostatic hyperplasia)   . Chronic kidney disease    stage 3  . Colon adenoma   . Diverticulosis   . Dysrhythmia   . Elevated PSA   . Erectile dysfunction   . Gastric ulcer   . History of DVT (deep vein thrombosis)   . History of stomach ulcers   . Hyperlipidemia   . Hyperprolactinemia ([place])   . Hypertension   . Seizures ([place])   . Stroke ([place])   . Tubular adenoma of colon   . Urinary retention   . Vitamin D deficiency     Surgical History: Past Surgical History:  Procedure Laterality Date   . COLONOSCOPY    . COLONOSCOPY WITH PROPOFOL N/A 03/26/2015   Procedure: COLONOSCOPY WITH PROPOFOL;  Surgeon: Lollie Sails, MD;  [facility];  Service: Endoscopy;  Laterality: N/A;  . COLONOSCOPY WITH PROPOFOL N/A 08/30/2018   Procedure: COLONOSCOPY WITH PROPOFOL;  Surgeon: Lollie Sails, MD;  Location: [facility] ENDOSCOPY;  Service: Endoscopy;  Laterality: N/A;  . SHOULDER SURGERY  1991  . thumb surgery  1984    Home Medications:  Allergies as of 10/24/2020      Reactions   Asa [aspirin]       Medication List       Accurate as of October 24, 2020 11:59 PM. If you have any questions, ask your nurse or doctor.        Cholecalciferol 25 MCG (1000 UT) tablet Take by mouth.   clindamycin 300 MG capsule Commonly known as: CLEOCIN Take 1 capsule (300 mg total) by mouth 3 (three) times daily. Started by: Dominic Council, PA-C   finasteride 5 MG tablet Commonly known as: PROSCAR Take 1 tablet (5 mg total) by mouth daily.   sildenafil 100 MG tablet Commonly known as: VIAGRA Take 1 tablet (100 mg total) by mouth daily as needed for erectile dysfunction. Take two hours prior to intercourse on an empty stomach   sildenafil 100 MG tablet Commonly known as: VIAGRA Take 1 tablet (100 mg total) by mouth daily as needed for erectile dysfunction.   simvastatin 40 MG tablet Commonly known as: ZOCOR Take 40 mg by mouth daily.   tamsulosin 0.4 MG Caps capsule Commonly known as: FLOMAX Take 1 capsule (0.4 mg total) by mouth daily.   verapamil 240 MG 24 hr capsule Commonly known as: VERELAN PM Take 240 mg by mouth at bedtime.   warfarin 2 MG tablet Commonly known as: COUMADIN Take 2 mg by mouth daily.   warfarin 4 MG tablet Commonly known as: COUMADIN       Allergies:  Allergies  Allergen Reactions  . Asa [Aspirin]     Family History: Family History  Problem Relation Age of Onset  . Diabetes Mother   . Hypertension Mother   . Heart disease Father   .  Hypertension Father   . Kidney disease Neg Hx   . Prostate cancer Neg  Hx     Social History:  reports that he has never smoked. He has never used smokeless tobacco. He reports previous alcohol use. He reports that he does not use drugs.  ROS: For pertinent review of systems please refer to history of present illness  Physical Exam: BP 133/85   Pulse 71   Ht 5\' 3"  (1.6 m)   Wt 175 lb (79.4 kg)   BMI 31.00 kg/m   Constitutional:  Well nourished. Alert and oriented, No acute distress. HEENT: [place] AT, mask in place.  Trachea midline Cardiovascular: No clubbing, cyanosis, or edema. Respiratory: Normal respiratory effort, no increased work of breathing. GU: No CVA tenderness.  No bladder fullness or masses.  Patient with uncircumcised phallus.  Foreskin easily retracted  Urethral meatus is patent.  No penile discharge. No penile lesions or rashes. Scrotum without lesions, cysts, rashes and/or edema.  Testicles are located scrotally bilaterally. Right testicle is exquisitely tender to palpation.  No masses are appreciated in the testicles. Left and right epididymis are normal. Rectal: Patient with  normal sphincter tone. Anus and perineum without scarring or rashes. No rectal masses are appreciated. Prostate is approximately 45 grams, no nodules are appreciated. Seminal vesicles could not be palpated.  Lymph: No inguinal adenopathy. Neurologic: Grossly intact, no focal deficits, moving all 4 extremities. Psychiatric: Normal mood and affect.  Laboratory Data: Component     Latest Ref Rng & Units 04/16/2016 05/21/2016 10/13/2016 04/01/2017  Prostate Specific Ag, Serum     0.0 - 4.0 ng/mL 3.7 2.6 3.3 1.1   Component     Latest Ref Rng & Units 02/01/2018 05/10/2018 11/08/2018 03/14/2020  Prostate Specific Ag, Serum     0.0 - 4.0 ng/mL 2.1 0.9 1.1 2.4   Component     Latest Ref Rng & Units 10/17/2020  Prostate Specific Ag, Serum     0.0 - 4.0 ng/mL 1.6   Urinalysis Component     Latest Ref Rng &  Units 10/24/2020  Specific Gravity, UA     1.005 - 1.030 1.020  pH, UA     5.0 - 7.5 5.5  Color, UA     Yellow Yellow  Appearance Ur     Clear Clear  Leukocytes,UA     Negative Negative  Protein,UA     Negative/Trace Trace (A)  Glucose, UA     Negative Negative  Ketones, UA     Negative Negative  RBC, UA     Negative Trace (A)  Bilirubin, UA     Negative Negative  Urobilinogen, Ur     0.2 - 1.0 mg/dL 0.2  Nitrite, UA     Negative Negative  Microscopic Examination      See below:   Component     Latest Ref Rng & Units 10/24/2020  WBC, UA     0 - 5 /hpf 0-5  RBC     0 - 2 /hpf 0-2  Epithelial Cells (non renal)     0 - 10 /hpf 0-10  Bacteria, UA     None seen/Few None seen   I have reviewed the labs.  Pertinent imaging CLINICAL DATA:  Right-sided scrotal swelling and pain for 2 weeks. Rule out torsion.  EXAM: SCROTAL ULTRASOUND  DOPPLER ULTRASOUND OF THE TESTICLES  TECHNIQUE: Complete ultrasound examination of the testicles, epididymis, and other scrotal structures was performed. Color and spectral Doppler ultrasound were also utilized to evaluate blood flow to the testicles.  COMPARISON:  None.  FINDINGS: Right testicle  Measurements: 3.3 x 1.6 x 2.4 cm. Diffusely heterogeneous in echotexture.  Left testicle  Measurements:  3.6 x 1.9 x 2.5 cm.  Normal in morphology.  Right epididymis:  Hyperemic, including on image 58.  Left epididymis:  7 mm epididymal cyst or spermatocele.  Hydrocele: Small right hydrocele. Left-sided scrotal fluid is likely physiologic.  Varicocele:  Absent  Pulsed Doppler interrogation of both testes demonstrates normal low resistance arterial and venous waveforms bilaterally.  IMPRESSION: Hyperemic right epididymis with heterogeneous right testicular echotexture, most consistent with epididymitis/orchitis. No evidence of testicular torsion.   Electronically Signed   By: Abigail Miyamoto M.D.   On:  10/24/2020 14:44 I have independently reviewed the films.  See HPI.    Assessment & Plan:    1.  Right epididymitis/orchitis -Prescribed clindamycin 300 mg 3 times daily for 10 days -Reviewed red flag signs  2.  BPH with LUTS - IPSS score has slightly worsened - Continue conservative management, avoiding bladder irritants and timed voiding's - Continue finasteride 5 mg daily -Continue tamsulosin 0.4 mg daily  3. Erectile dysfunction - SHIM score is slightly improved - Continue sildenafil 100 mg, on-demand dosing  Return in about 2 weeks (around 11/07/2020) for Recheck for epididymitis/orchitis.  Dominic Council, PA-C  [facility] [address] [place], [place] [address] [phone]

## 2020-10-24 ENCOUNTER — Other Ambulatory Visit: Payer: Self-pay

## 2020-10-24 ENCOUNTER — Ambulatory Visit: Admission: RE | Admit: 2020-10-24 | Payer: Medicare Other | Source: Ambulatory Visit

## 2020-10-24 ENCOUNTER — Ambulatory Visit
Admission: RE | Admit: 2020-10-24 | Discharge: 2020-10-24 | Disposition: A | Discharge status: Home / Self Care | Payer: Medicare Other | Source: Ambulatory Visit | Attending: Urology | Admitting: Urology

## 2020-10-24 ENCOUNTER — Other Ambulatory Visit: Payer: Medicare Other

## 2020-10-24 ENCOUNTER — Ambulatory Visit (INDEPENDENT_AMBULATORY_CARE_PROVIDER_SITE_OTHER): Payer: Medicare Other | Admitting: Urology

## 2020-10-24 ENCOUNTER — Encounter: Payer: Self-pay | Admitting: Urology

## 2020-10-24 VITALS — BP 133/85 | HR 71 | Ht 63.0 in | Wt 175.0 lb

## 2020-10-24 DIAGNOSIS — N529 Male erectile dysfunction, unspecified: Secondary | ICD-10-CM

## 2020-10-24 DIAGNOSIS — N138 Other obstructive and reflux uropathy: Secondary | ICD-10-CM

## 2020-10-24 DIAGNOSIS — N401 Enlarged prostate with lower urinary tract symptoms: Secondary | ICD-10-CM | POA: Diagnosis not present

## 2020-10-24 DIAGNOSIS — N5089 Other specified disorders of the male genital organs: Secondary | ICD-10-CM | POA: Insufficient documentation

## 2020-10-24 DIAGNOSIS — N453 Epididymo-orchitis: Secondary | ICD-10-CM | POA: Diagnosis not present

## 2020-10-24 LAB — BLADDER SCAN AMB NON-IMAGING

## 2020-10-24 MED ORDER — CLINDAMYCIN HCL 300 MG PO CAPS: 300.0000 mg | ORAL_CAPSULE | Freq: Three times a day (TID) | ORAL | 0 refills | Status: DC

## 2020-10-25 LAB — MICROSCOPIC EXAMINATION: Bacteria, UA: NONE SEEN

## 2020-10-25 LAB — URINALYSIS, COMPLETE
Bilirubin, UA: NEGATIVE
Glucose, UA: NEGATIVE
Ketones, UA: NEGATIVE
Leukocytes,UA: NEGATIVE
Nitrite, UA: NEGATIVE
Specific Gravity, UA: 1.02 (ref 1.005–1.030)
Urobilinogen, Ur: 0.2 mg/dL (ref 0.2–1.0)
pH, UA: 5.5 (ref 5.0–7.5)

## 2020-11-06 NOTE — Progress Notes (Signed)
11/10/2018 4:01 PM   [name]. 1951-08-02 [id number]  Referring provider: Derinda Late, MD [phone] [facility] [place],  [place] [address]  Chief Complaint  Patient presents with  . Follow-up   Urological history: 1. BPH with LU TS - 1.6 in 09/2020 - I PSS 12/2 - PVR 65 mL - managed with tamsulosin 0.4 mg daily and finasteride 5 mg daily  2. ED - SHIM 94 - contributing factors of age, BPH, HLD, and CVA - managed with sildenafil 100 mg, on-demand-dosing   HPI: Dondi Aime. is a 70 y.o. male who presents today for a swollen testicle.    At his visit 2 weeks ago, he was diagnosed with epididymitis/orchitis and he was given clindamycin 300 mg 3 times daily for 2 weeks.  Today he states his scrotal tenderness has completely resolved.  Patient denies any modifying or aggravating factors.  Patient denies any gross hematuria, dysuria or suprapubic/flank pain.  Patient denies any fevers, chills, nausea or vomiting.    PMH: Past Medical History:  Diagnosis Date  . A-fib ([place])   . BPH (benign prostatic hyperplasia)   . Chronic kidney disease    stage 3  . Colon adenoma   . Diverticulosis   . Dysrhythmia   . Elevated PSA   . Erectile dysfunction   . Gastric ulcer   . History of DVT (deep vein thrombosis)   . History of stomach ulcers   . Hyperlipidemia   . Hyperprolactinemia ([place])   . Hypertension   . Seizures ([place])   . Stroke ([place])   . Tubular adenoma of colon   . Urinary retention   . Vitamin D deficiency     Surgical History: Past Surgical History:  Procedure Laterality Date  . COLONOSCOPY    . COLONOSCOPY WITH PROPOFOL N/A 03/26/2015   Procedure: COLONOSCOPY WITH PROPOFOL;  Surgeon: Lollie Sails, MD;  Location: [facility] ENDOSCOPY;  Service: Endoscopy;  Laterality: N/A;  . COLONOSCOPY WITH PROPOFOL N/A 08/30/2018   Procedure: COLONOSCOPY WITH PROPOFOL;  Surgeon: Lollie Sails, MD;  Location: [facility]  ENDOSCOPY;  Service: Endoscopy;  Laterality: N/A;  . SHOULDER SURGERY  1991  . thumb surgery  1984    Home Medications:  Allergies as of 11/07/2020      Reactions   Asa [aspirin]       Medication List       Accurate as of November 07, 2020 11:59 PM. If you have any questions, ask your nurse or doctor.        Cholecalciferol 25 MCG (1000 UT) tablet Take by mouth.   clindamycin 300 MG capsule Commonly known as: CLEOCIN Take 1 capsule (300 mg total) by mouth 3 (three) times daily.   finasteride 5 MG tablet Commonly known as: PROSCAR Take 1 tablet (5 mg total) by mouth daily.   sildenafil 100 MG tablet Commonly known as: VIAGRA Take 1 tablet (100 mg total) by mouth daily as needed for erectile dysfunction. Take two hours prior to intercourse on an empty stomach What changed: Another medication with the same name was removed. Continue taking this medication, and follow the directions you see here. Changed by: Zara Council, PA-C   simvastatin 40 MG tablet Commonly known as: ZOCOR Take 40 mg by mouth daily.   tamsulosin 0.4 MG Caps capsule Commonly known as: FLOMAX Take 1 capsule (0.4 mg total) by mouth daily.   verapamil 240 MG 24 hr  capsule Commonly known as: VERELAN PM Take 240 mg by mouth at bedtime.   warfarin 3 MG tablet Commonly known as: COUMADIN Take 3 mg by mouth 2 (two) times a week. What changed: Another medication with the same name was removed. Continue taking this medication, and follow the directions you see here. Changed by: Zara Council, PA-C   warfarin 5 MG tablet Commonly known as: COUMADIN Take 5 mg by mouth daily. What changed: Another medication with the same name was removed. Continue taking this medication, and follow the directions you see here. Changed by: Zara Council, PA-C       Allergies:  Allergies  Allergen Reactions  . Asa [Aspirin]     Family History: Family History  Problem Relation Age of Onset  . Diabetes Mother    . Hypertension Mother   . Heart disease Father   . Hypertension Father   . Kidney disease Neg Hx   . Prostate cancer Neg Hx     Social History:  reports that he has never smoked. He has never used smokeless tobacco. He reports previous alcohol use. He reports that he does not use drugs.  ROS: For pertinent review of systems please refer to history of present illness  Physical Exam: BP 139/84   Pulse 66   Ht 5\' 3"  (1.6 m)   Wt 175 lb (79.4 kg)   BMI 31.00 kg/m   Constitutional:  Well nourished. Alert and oriented, No acute distress. HEENT: [place] AT, mask in place.  Trachea midline Cardiovascular: No clubbing, cyanosis, or edema. Respiratory: Normal respiratory effort, no increased work of breathing. GU: Scrotum without lesions, cysts, rashes and/or edema.  Testicles are located scrotally bilaterally. No masses are appreciated in the testicles. Left and right epididymis are normal. Neurologic: Grossly intact, no focal deficits, moving all 4 extremities. Psychiatric: Normal mood and affect.  Laboratory Data: No recent imaging   Pertinent imaging No recent imaging    Assessment & Plan:    1.  Right epididymitis/orchitis -Resolved  2.  BPH with LUTS - IPSS score has slightly worsened - Continue conservative management, avoiding bladder irritants and timed voiding's - Continue finasteride 5 mg daily -Continue tamsulosin 0.4 mg daily  3. Erectile dysfunction - SHIM score is slightly improved - Continue sildenafil 100 mg, on-demand dosing  Return in about 1 year (around 11/07/2021) for IPSS, SHIM, PSA and exam.  Zara Council, [facility]  [place], [place] [address] [phone]

## 2020-11-07 ENCOUNTER — Other Ambulatory Visit: Payer: Self-pay

## 2020-11-07 ENCOUNTER — Encounter: Payer: Self-pay | Admitting: Urology

## 2020-11-07 ENCOUNTER — Ambulatory Visit (INDEPENDENT_AMBULATORY_CARE_PROVIDER_SITE_OTHER): Payer: Medicare Other | Admitting: Urology

## 2020-11-07 VITALS — BP 139/84 | HR 66 | Ht 63.0 in | Wt 175.0 lb

## 2020-11-07 DIAGNOSIS — N138 Other obstructive and reflux uropathy: Secondary | ICD-10-CM

## 2020-11-07 DIAGNOSIS — N453 Epididymo-orchitis: Secondary | ICD-10-CM

## 2020-11-07 DIAGNOSIS — N401 Enlarged prostate with lower urinary tract symptoms: Secondary | ICD-10-CM | POA: Diagnosis not present

## 2021-03-09 ENCOUNTER — Other Ambulatory Visit: Payer: Self-pay | Admitting: Urology

## 2021-03-11 ENCOUNTER — Other Ambulatory Visit: Payer: Self-pay | Admitting: Urology

## 2021-03-11 DIAGNOSIS — R972 Elevated prostate specific antigen [PSA]: Secondary | ICD-10-CM

## 2021-04-15 ENCOUNTER — Other Ambulatory Visit: Payer: Self-pay

## 2021-04-23 ENCOUNTER — Ambulatory Visit: Payer: Self-pay | Admitting: Urology

## 2021-05-06 ENCOUNTER — Other Ambulatory Visit: Payer: Self-pay | Admitting: Urology

## 2021-06-28 ENCOUNTER — Other Ambulatory Visit: Payer: Self-pay | Admitting: Urology

## 2021-06-28 DIAGNOSIS — N453 Epididymo-orchitis: Secondary | ICD-10-CM

## 2021-11-06 ENCOUNTER — Other Ambulatory Visit: Payer: Medicare Other

## 2021-11-06 ENCOUNTER — Other Ambulatory Visit: Payer: Self-pay

## 2021-11-06 DIAGNOSIS — N138 Other obstructive and reflux uropathy: Secondary | ICD-10-CM

## 2021-11-07 ENCOUNTER — Other Ambulatory Visit: Payer: Self-pay

## 2021-11-07 LAB — PSA: Prostate Specific Ag, Serum: 1.8 ng/mL (ref 0.0–4.0)

## 2021-11-13 ENCOUNTER — Ambulatory Visit: Payer: Self-pay | Admitting: Urology

## 2021-11-19 NOTE — Progress Notes (Signed)
11/27/21 ?4:04 PM  ? ?Dominic Brooks. ?06-Aug-1951 ?[id number] ? ?Referring provider:  ?Derinda Late, MD ?[address] ?[facility] ?[place],  [place] [address] ? ?Chief Complaint  ?Patient presents with  ? Benign Prostatic Hypertrophy  ? ? ?Urological history: ?1. BPH with LU TS ?- PSA 1.8 on 11/06/2021 ?- I PSS 7/2 ?- managed with tamsulosin 0.4 mg daily and finasteride 5 mg daily ?  ?2. ED ?- SHIM 21 ?- contributing factors of age, BPH, HLD, and CVA ?- managed with sildenafil 100 mg, on-demand-dosing  ? ?3. Right epididymitis/orchitis ?-Resolved ?  ? ?HPI: ?Dominic Brooks. is a 71 y.o.male who presents today for a 1 year follow-up with IPSS, SHIM, and prior PSA.  ? ?He reports that he saw blood in his ejaculate fluid he wonders if this is due to medication he is taking.  He states the ejaculation is not painful.  He states he is in a monogamous relationship.  He denied any blood in the urine or in the stool. ? ?He feels like he is emptying his bladder fully. ? ?Patient denies any modifying or aggravating factors.  Patient denies any gross hematuria, dysuria or suprapubic/flank pain.  Patient denies any fevers, chills, nausea or vomiting.  ? ?UA negative. ? ? ? IPSS   ? ? [place] Name 11/27/21 1500  ?  ?  ?  ? International Prostate Symptom Score  ? How often have you had the sensation of not emptying your bladder? Less than 1 in 5    ? How often have you had to urinate less than every two hours? Less than half the time    ? How often have you found you stopped and started again several times when you urinated? Less than 1 in 5 times    ? How often have you found it difficult to postpone urination? Not at All    ? How often have you had a weak urinary stream? Not at All    ? How often have you had to strain to start urination? Not at All    ? How many times did you typically get up at night to urinate? 3 Times    ? Total IPSS Score 7    ?  ? Quality of Life due to urinary symptoms  ?  If you were to spend the rest of your life with your urinary condition just the way it is now how would you feel about that? Mostly Satisfied    ? ?  ?  ? ?  ? ? ?Score:  ?1-7 Mild ?8-19 Moderate ?20-35 Severe ? ?Patient still having spontaneous erections.  He denies any pain or curvature with erections.  Satisfactory erections with sildenafil. ? ? SHIM   ? ? [place] Name 11/27/21 1554  ?  ?  ?  ? SHIM: Over the last 6 months:  ? How do you rate your confidence that you could get and keep an erection? High    ? When you had erections with sexual stimulation, how often were your erections hard enough for penetration (entering your partner)? Most Times (much more than half the time)    ? During sexual intercourse, how often were you able to maintain your erection after you had penetrated (entered) your partner? Most Times (much more than half the time)    ? During sexual intercourse, how difficult was it to maintain your erection to completion of intercourse? Not Difficult    ?  When you attempted sexual intercourse, how often was it satisfactory for you? Most Times (much more than half the time)    ?  ? SHIM Total Score  ? SHIM 21    ? ?  ?  ? ?  ? ? ?Score: ?1-7 Severe ED ?8-11 Moderate ED ?12-16 Mild-Moderate ED ?17-21 Mild ED ?22-25 No ED  ? ?PMH: ?Past Medical History:  ?Diagnosis Date  ? A-fib ([place])   ? BPH (benign prostatic hyperplasia)   ? Chronic kidney disease   ? stage 3  ? Colon adenoma   ? Diverticulosis   ? Dysrhythmia   ? Elevated PSA   ? Erectile dysfunction   ? Gastric ulcer   ? History of DVT (deep vein thrombosis)   ? History of stomach ulcers   ? Hyperlipidemia   ? Hyperprolactinemia ([place])   ? Hypertension   ? Seizures ([place])   ? Stroke [facility])   ? Tubular adenoma of colon   ? Urinary retention   ? Vitamin D deficiency   ? ? ?Surgical History: ?Past Surgical History:  ?Procedure Laterality Date  ? COLONOSCOPY    ? COLONOSCOPY WITH PROPOFOL N/A 03/26/2015  ? Procedure: COLONOSCOPY WITH PROPOFOL;  Surgeon:  Lollie Sails, MD;  Location: [facility] ENDOSCOPY;  Service: Endoscopy;  Laterality: N/A;  ? COLONOSCOPY WITH PROPOFOL N/A 08/30/2018  ? Procedure: COLONOSCOPY WITH PROPOFOL;  Surgeon: Lollie Sails, MD;  Location: [facility] ENDOSCOPY;  Service: Endoscopy;  Laterality: N/A;  ? SHOULDER SURGERY  1991  ? thumb surgery  1984  ? ? ?Home Medications:  ?Allergies as of 11/20/2021   ? ?   Reactions  ? Asa [aspirin]   ? ?  ? ?  ?Medication List  ?  ? ?  ? Accurate as of November 20, 2021 11:59 PM. If you have any questions, ask your nurse or doctor.  ?  ?  ? ?  ? ?Cholecalciferol 25 MCG (1000 UT) tablet ?Take by mouth. ?  ?clindamycin 300 MG capsule ?Commonly known as: CLEOCIN ?Take 1 capsule (300 mg total) by mouth 3 (three) times daily. ?  ?finasteride 5 MG tablet ?Commonly known as: PROSCAR ?TAKE 1 TABLET DAILY ?  ?sildenafil 100 MG tablet ?Commonly known as: VIAGRA ?TAKE 1 TABLET DAILY AS NEEDED FOR ERECTILE DYSFUNCTION ?  ?simvastatin 40 MG tablet ?Commonly known as: ZOCOR ?Take 40 mg by mouth daily. ?  ?tamsulosin 0.4 MG Caps capsule ?Commonly known as: FLOMAX ?TAKE 1 CAPSULE DAILY ?  ?verapamil 240 MG 24 hr capsule ?Commonly known as: VERELAN PM ?Take 240 mg by mouth at bedtime. ?  ?warfarin 3 MG tablet ?Commonly known as: COUMADIN ?Take 3 mg by mouth 2 (two) times a week. ?  ?warfarin 5 MG tablet ?Commonly known as: COUMADIN ?Take 5 mg by mouth daily. ?  ? ?  ? ? ?Allergies:  ?Allergies  ?Allergen Reactions  ? Asa [Aspirin]   ? ? ?Family History: ?Family History  ?Problem Relation Age of Onset  ? Diabetes Mother   ? Hypertension Mother   ? Heart disease Father   ? Hypertension Father   ? Kidney disease Neg Hx   ? Prostate cancer Neg Hx   ? ? ?Social History:  reports that he has never smoked. He has never used smokeless tobacco. He reports that he does not currently use alcohol. He reports that he does not use drugs. ? ? ?Physical Exam: ?BP (!) 142/86   Pulse 70   Ht '5\' 3"'$  (1.6  m)   Wt 173 lb (78.5 kg)   BMI 30.65  kg/m?   ?Constitutional:  Alert and oriented, No acute distress. ?HEENT: [place] AT, moist mucus membranes.  Trachea midline ?Cardiovascular: No clubbing, cyanosis, or edema. ?Respiratory: Normal respiratory effort, no increased work of breathing. ?GU:  No CVA tenderness.  No bladder fullness or masses.  Patient with uncircumcised phallus. Foreskin easily retracted  Urethral meatus is patent.  No penile discharge. No penile lesions or rashes. Scrotum without lesions, cysts, rashes and/or edema.  Bilateral hydroceles, testicles are located scrotally bilaterally. No masses are appreciated in the testicles. Left and right epididymis are normal. ?Rectal: Patient with  normal sphincter tone. Anus and perineum without scarring or rashes. No rectal masses are appreciated. Prostate is approximately 50 grams, could only palpate the apex, no nodules are appreciated. Seminal vesicles could not be palpated ?Neurologic: Grossly intact, no focal deficits, moving all 4 extremities. ?Psychiatric: Normal mood and affect. ? ?Laboratory Data: ? Prostate Specific Ag, Serum  ?Latest Ref Rng 0.0 - 4.0 ng/mL  ?04/01/2017 1.1   ?02/01/2018 2.1   ?05/10/2018 0.9   ?11/08/2018 1.1   ?03/14/2020 2.4   ?10/17/2020 1.6   ?11/06/2021 1.8   ? ?WBC (White Blood Cell Count) 4.1 - 10.2 10?3/uL 4.2   ?RBC (Red Blood Cell Count) 4.69 - 6.13 10?6/uL 4.51 Low    ?Hemoglobin 14.1 - 18.1 gm/dL 15.1   ?Hematocrit 40.0 - 52.0 % 44.2   ?MCV (Mean Corpuscular Volume) 80.0 - 100.0 fl 98.0   ?MCH (Mean Corpuscular Hemoglobin) 27.0 - 31.2 pg 33.5 High    ?MCHC (Mean Corpuscular Hemoglobin Concentration) 32.0 - 36.0 gm/dL 34.2   ?Platelet Count 150 - 450 10?3/uL 272   ?RDW-CV (Red Cell Distribution Width) 11.6 - 14.8 % 13.2   ?MPV (Mean Platelet Volume) 9.4 - 12.4 fl 8.9 Low    ?Neutrophils 1.50 - 7.80 10?3/uL 1.82   ?Lymphocytes 1.00 - 3.60 10?3/uL 2.01   ?Monocytes 0.00 - 1.50 10?3/uL 0.37   ?Eosinophils 0.00 - 0.55 10?3/uL 0.01   ?Basophils 0.00 - 0.09 10?3/uL 0.00    ?Neutrophil % 32.0 - 70.0 % 43.2   ?Lymphocyte % 10.0 - 50.0 % 47.6   ?Monocyte % 4.0 - 13.0 % 8.8   ?Eosinophil % 1.0 - 5.0 % 0.2 Low    ?Basophil% 0.0 - 2.0 % 0.0   ?Immature Granulocyte % <=0.7 % 0.2   ?Immature G

## 2021-11-20 ENCOUNTER — Encounter: Payer: Self-pay | Admitting: Urology

## 2021-11-20 ENCOUNTER — Ambulatory Visit (INDEPENDENT_AMBULATORY_CARE_PROVIDER_SITE_OTHER): Payer: Medicare Other | Admitting: Urology

## 2021-11-20 VITALS — BP 142/86 | HR 70 | Ht 63.0 in | Wt 173.0 lb

## 2021-11-20 DIAGNOSIS — N138 Other obstructive and reflux uropathy: Secondary | ICD-10-CM | POA: Diagnosis not present

## 2021-11-20 DIAGNOSIS — R361 Hematospermia: Secondary | ICD-10-CM

## 2021-11-20 DIAGNOSIS — N529 Male erectile dysfunction, unspecified: Secondary | ICD-10-CM | POA: Diagnosis not present

## 2021-11-20 DIAGNOSIS — N401 Enlarged prostate with lower urinary tract symptoms: Secondary | ICD-10-CM

## 2021-11-20 MED ORDER — SILDENAFIL CITRATE 100 MG PO TABS: ORAL_TABLET | ORAL | 11 refills | Status: DC

## 2021-11-21 LAB — MICROSCOPIC EXAMINATION: Bacteria, UA: NONE SEEN

## 2021-11-21 LAB — URINALYSIS, COMPLETE
Bilirubin, UA: NEGATIVE
Glucose, UA: NEGATIVE
Ketones, UA: NEGATIVE
Leukocytes,UA: NEGATIVE
Nitrite, UA: NEGATIVE
Specific Gravity, UA: 1.025 (ref 1.005–1.030)
Urobilinogen, Ur: 0.2 mg/dL (ref 0.2–1.0)
pH, UA: 5.5 (ref 5.0–7.5)

## 2022-01-23 ENCOUNTER — Emergency Department
Admission: EM | Admit: 2022-01-23 | Discharge: 2022-01-24 | Disposition: A | Discharge status: Home / Self Care | Payer: Medicare Other | Attending: Emergency Medicine | Admitting: Emergency Medicine

## 2022-01-23 ENCOUNTER — Emergency Department: Payer: Medicare Other

## 2022-01-23 ENCOUNTER — Encounter: Payer: Self-pay | Admitting: Emergency Medicine

## 2022-01-23 ENCOUNTER — Other Ambulatory Visit: Payer: Self-pay

## 2022-01-23 DIAGNOSIS — R791 Abnormal coagulation profile: Secondary | ICD-10-CM | POA: Diagnosis not present

## 2022-01-23 DIAGNOSIS — I10 Essential (primary) hypertension: Secondary | ICD-10-CM | POA: Insufficient documentation

## 2022-01-23 DIAGNOSIS — R42 Dizziness and giddiness: Secondary | ICD-10-CM | POA: Diagnosis not present

## 2022-01-23 DIAGNOSIS — R519 Headache, unspecified: Secondary | ICD-10-CM | POA: Insufficient documentation

## 2022-01-23 DIAGNOSIS — Z20822 Contact with and (suspected) exposure to covid-19: Secondary | ICD-10-CM | POA: Insufficient documentation

## 2022-01-23 LAB — CBC
HCT: 47.8 % (ref 39.0–52.0)
Hemoglobin: 15.9 g/dL (ref 13.0–17.0)
MCH: 32.1 pg (ref 26.0–34.0)
MCHC: 33.3 g/dL (ref 30.0–36.0)
MCV: 96.4 fL (ref 80.0–100.0)
Platelets: 246 10*3/uL (ref 150–400)
RBC: 4.96 MIL/uL (ref 4.22–5.81)
RDW: 13 % (ref 11.5–15.5)
WBC: 5.1 10*3/uL (ref 4.0–10.5)
nRBC: 0 % (ref 0.0–0.2)

## 2022-01-23 LAB — COMPREHENSIVE METABOLIC PANEL
ALT: 31 U/L (ref 0–44)
AST: 26 U/L (ref 15–41)
Albumin: 5 g/dL (ref 3.5–5.0)
Alkaline Phosphatase: 85 U/L (ref 38–126)
Anion gap: 8 (ref 5–15)
BUN: 15 mg/dL (ref 8–23)
CO2: 26 mmol/L (ref 22–32)
Calcium: 9.9 mg/dL (ref 8.9–10.3)
Chloride: 103 mmol/L (ref 98–111)
Creatinine, Ser: 1.41 mg/dL — ABNORMAL HIGH (ref 0.61–1.24)
GFR, Estimated: 54 mL/min — ABNORMAL LOW (ref >60)
Glucose, Bld: 104 mg/dL — ABNORMAL HIGH (ref 70–99)
Potassium: 4.3 mmol/L (ref 3.5–5.1)
Sodium: 137 mmol/L (ref 135–145)
Total Bilirubin: 1 mg/dL (ref 0.3–1.2)
Total Protein: 8.6 g/dL — ABNORMAL HIGH (ref 6.5–8.1)

## 2022-01-23 LAB — URINALYSIS, COMPLETE (UACMP) WITH MICROSCOPIC
Bacteria, UA: NONE SEEN
Bilirubin Urine: NEGATIVE
Glucose, UA: NEGATIVE mg/dL
Hgb urine dipstick: NEGATIVE
Ketones, ur: 5 mg/dL — AB
Leukocytes,Ua: NEGATIVE
Nitrite: NEGATIVE
Protein, ur: NEGATIVE mg/dL
Specific Gravity, Urine: 1.006 (ref 1.005–1.030)
Squamous Epithelial / HPF: NONE SEEN (ref 0–5)
pH: 6 (ref 5.0–8.0)

## 2022-01-23 LAB — TROPONIN I (HIGH SENSITIVITY)
Troponin I (High Sensitivity): 9 ng/L (ref <18)
Troponin I (High Sensitivity): 10 ng/L (ref <18)

## 2022-01-23 LAB — SARS CORONAVIRUS 2 BY RT PCR: SARS Coronavirus 2 by RT PCR: NEGATIVE

## 2022-01-23 LAB — PROTIME-INR
INR: 1.8 — ABNORMAL HIGH (ref 0.8–1.2)
Prothrombin Time: 20.6 seconds — ABNORMAL HIGH (ref 11.4–15.2)

## 2022-01-23 MED ORDER — METOCLOPRAMIDE HCL 10 MG PO TABS: 10.0000 mg | ORAL_TABLET | Freq: Four times a day (QID) | ORAL | 0 refills | Status: DC | PRN

## 2022-01-23 MED ORDER — METOCLOPRAMIDE HCL 5 MG/ML IJ SOLN
10.0000 mg | INTRAMUSCULAR | Status: AC
  Administered 2022-01-24: 10 mg via INTRAVENOUS
  Filled 2022-01-23: qty 2

## 2022-01-23 MED ORDER — NAPROXEN 500 MG PO TABS: 500.0000 mg | ORAL_TABLET | Freq: Two times a day (BID) | ORAL | 0 refills | Status: DC

## 2022-01-23 MED ORDER — DIPHENHYDRAMINE HCL 50 MG/ML IJ SOLN
25.0000 mg | Freq: Once | INTRAMUSCULAR | Status: AC
  Administered 2022-01-24: 25 mg via INTRAVENOUS
  Filled 2022-01-23: qty 1

## 2022-01-23 MED ORDER — VERAPAMIL HCL ER 240 MG PO TBCR
240.0000 mg | EXTENDED_RELEASE_TABLET | ORAL | Status: AC
  Administered 2022-01-24: 240 mg via ORAL
  Filled 2022-01-23: qty 1

## 2022-01-23 MED ORDER — IOHEXOL 350 MG/ML SOLN
75.0000 mL | Freq: Once | INTRAVENOUS | Status: AC | PRN
  Administered 2022-01-23: 75 mL via INTRAVENOUS

## 2022-01-23 MED ORDER — KETOROLAC TROMETHAMINE 15 MG/ML IJ SOLN
15.0000 mg | Freq: Once | INTRAMUSCULAR | Status: AC
  Administered 2022-01-24: 15 mg via INTRAVENOUS
  Filled 2022-01-23: qty 1

## 2022-01-23 NOTE — ED Provider Notes (Signed)
-----------------------------------------   11:34 PM on 01/23/2022 -----------------------------------------  Assuming care from Dr. Joni Fears.  In short, Dominic Brooks. is a 71 y.o. male with a chief complaint of sudden onset headache.  Refer to the original H&P for additional details.  The current plan of care is to follow up CTA head/neck and reassess.   Clinical Course as of 01/24/22 8786  Thu Jan 23, 2022  2347 SARS Coronavirus 2 by RT PCR: NEGATIVE Interpreted results, negative for COVID-19 [CF]  Fri Jan 24, 2022  0054 CT ANGIO HEAD NECK W WO CM I reviewed and interpreted the CTA head and neck.  I see no evidence of obstruction, aneurysm, nor bleed.  Radiology report agrees that there are no acute findings.  I will reassess the patient and determine suitability for discharge. [CF]  [id number] Patient said he feels much better.  I went over the results with the patient and his wife and they agree with the plan for discharge and outpatient follow-up.  I gave my usual and customary return precautions. [CF]    Clinical Course User Index [CF] Hinda Kehr, MD   Final diagnoses:  Bad headache   Medications  verapamil (CALAN-SR) CR tablet 240 mg (240 mg Oral Given 01/24/22 0020)  metoCLOPramide (REGLAN) injection 10 mg (10 mg Intravenous Given 01/24/22 0025)  diphenhydrAMINE (BENADRYL) injection 25 mg (25 mg Intravenous Given 01/24/22 0021)  ketorolac (TORADOL) 15 MG/ML injection 15 mg (15 mg Intravenous Given 01/24/22 0023)  iohexol (OMNIPAQUE) 350 MG/ML injection 75 mL (75 mLs Intravenous Contrast Given 01/23/22 2325)      Hinda Kehr, MD 01/24/22 [phone]

## 2022-01-23 NOTE — ED Notes (Signed)
Seen by PA Gains in triage

## 2022-01-23 NOTE — Discharge Instructions (Addendum)

## 2022-01-23 NOTE — ED Provider Notes (Signed)
[facility] Provider Note    Event Date/Time   First MD Initiated Contact with Patient 01/23/22 2057     (approximate)   History   Chief Complaint: Dizziness   HPI  Dominic Age. is a 71 y.o. male with a history of hypertension, CVA, seizures who comes the ED complaining of rapid onset severe headache that started this morning while walking.  No aggravating or alleviating factors.  Associated with a feeling of dizziness/motion, no lightheadedness or syncope.  No vision changes, no focal paresthesias or weakness.  No recent trauma, no neck pain or stiffness.  No fever or chills.  Pain is at the right parietal scalp.     Physical Exam   Triage Vital Signs: ED Triage Vitals  Enc Vitals Group     BP 01/23/22 1810 (!) 161/98     Pulse Rate 01/23/22 1810 (!) 45     Resp 01/23/22 1810 18     Temp 01/23/22 1810 98.7 F (37.1 C)     Temp Source 01/23/22 1810 Oral     SpO2 01/23/22 1810 94 %     Weight 01/23/22 1823 175 lb (79.4 kg)     Height 01/23/22 1823 '5\' 3"'$  (1.6 m)     Head Circumference --      Peak Flow --      Pain Score 01/23/22 1822 9     Pain Loc --      Pain Edu? --      Excl. in [place]? --     Most recent vital signs: Vitals:   01/23/22 2230 01/23/22 2300  BP: (!) 165/97 (!) 164/90  Pulse: 80 77  Resp: (!) 25 (!) 24  Temp:    SpO2: 98% 95%    General: Awake, no distress.  CV:  Good peripheral perfusion.  Regular rate rhythm Resp:  Normal effort.  Clear to auscultation bilaterally Abd:  No distention.  Other:  EOMI, PERRL, no nystagmus.  Cranial nerves II through XII intact.  Motor function intact bilaterally, coordination intact.  Neuro exam unremarkable.  Symmetric temporal artery pulsations, no temporal artery tenderness or other scalp tenderness or swelling   ED Results / Procedures / Treatments   Labs (all labs ordered are listed, but only abnormal results are displayed) Labs Reviewed  URINALYSIS, COMPLETE (UACMP) WITH  MICROSCOPIC - Abnormal; Notable for the following components:      Result Value   Color, Urine STRAW (*)    APPearance CLEAR (*)    Ketones, ur 5 (*)    All other components within normal limits  COMPREHENSIVE METABOLIC PANEL - Abnormal; Notable for the following components:   Glucose, Bld 104 (*)    Creatinine, Ser 1.41 (*)    Total Protein 8.6 (*)    GFR, Estimated 54 (*)    All other components within normal limits  PROTIME-INR - Abnormal; Notable for the following components:   Prothrombin Time 20.6 (*)    INR 1.8 (*)    All other components within normal limits  SARS CORONAVIRUS 2 BY RT PCR  CBC  TROPONIN I (HIGH SENSITIVITY)  TROPONIN I (HIGH SENSITIVITY)     EKG Interpreted by me Sinus rhythm rate of 71.  Normal axis intervals QRS ST segments and T waves   RADIOLOGY CT head viewed and interpreted by me, negative for mass or intracranial hemorrhage.  Radiology report reviewed.  CT angiogram head neck pending   PROCEDURES:  Procedures   MEDICATIONS ORDERED  IN ED: Medications  verapamil (CALAN-SR) CR tablet 240 mg (has no administration in time range)  metoCLOPramide (REGLAN) injection 10 mg (has no administration in time range)  diphenhydrAMINE (BENADRYL) injection 25 mg (has no administration in time range)  ketorolac (TORADOL) 15 MG/ML injection 15 mg (has no administration in time range)  iohexol (OMNIPAQUE) 350 MG/ML injection 75 mL (75 mLs Intravenous Contrast Given 01/23/22 2325)     IMPRESSION / MDM / ASSESSMENT AND PLAN / ED COURSE  I reviewed the triage vital signs and the nursing notes.                              Differential diagnosis includes, but is not limited to, intracranial hemorrhage, cerebral aneurysm, intracranial mass, migraine type headache.  Patient's presentation is most consistent with acute presentation with potential threat to life or bodily function.  Patient presents with sudden onset of a severe headache this morning.  He is  neuro intact.  Blood pressure is not alarming, pretty stable at 160/90 in the ED.  I will give him his home dose of nightly blood pressure medicine, sustained-release verapamil.  Initial exam is unremarkable.  Noncontrast CT head is unremarkable.  Will obtain CT angiogram of the head and neck, if negative for aneurysm or other severe findings, I think the patient does not require admission and can be discharged home to follow-up with primary care with supportive care.   Considering the patient's symptoms, medical history, and physical examination today, I have low suspicion for ischemic stroke, subarachnoid hemorrhage, meningitis, encephalitis, carotid or vertebral dissection, venous sinus thrombosis, MS, intracranial hypertension, glaucoma, CRAO, CRVO, or temporal arteritis.    Clinical Course as of 01/23/22 2349  Thu Jan 23, 2022  2347 SARS Coronavirus 2 by RT PCR: NEGATIVE Interpreted results, negative for COVID-19 [CF]    Clinical Course User Index [CF] Hinda Kehr, MD     FINAL CLINICAL IMPRESSION(S) / ED DIAGNOSES   Final diagnoses:  Bad headache     Rx / DC Orders   ED Discharge Orders          Ordered    naproxen (NAPROSYN) 500 MG tablet  2 times daily with meals        01/23/22 2348    metoCLOPramide (REGLAN) 10 MG tablet  Every 6 hours PRN        01/23/22 2348             Note:  This document was prepared using Dragon voice recognition software and may include unintentional dictation errors.   Carrie Mew, MD 01/23/22 [phone]

## 2022-01-23 NOTE — ED Notes (Addendum)
Pt presents to the ED with c/o dizziness. Pt states that today he took his BP and it was around 180/100. Pt went to sent here to be evaluated. Pt states he takes his BP medications everyday.   Pt denies dizziness at this time. Pt c/o headache. Pt is A&Ox4. Pt denies chest pain and SOB.

## 2022-01-23 NOTE — ED Triage Notes (Signed)
Pt arrived to ED from home with c/o hypertension and dizziness with severe headache. Pt states he does take blood pressure meds and has not missed any doses. Hx of CVA with similar symptoms. Onset of symptoms was around 9am.  Pt seen at [facility] clinic and sent to this ED for further treament.

## 2022-01-23 NOTE — ED Provider Triage Note (Signed)
Emergency Medicine Provider Triage Evaluation Note  Dominic Reel. , a 71 y.o. male  was evaluated in triage.  Pt complains of headache, hypertension, dizziness.  No chest pain or shortness of breath.  No nausea or vomiting.  Denies any weakness, vision changes, slurred speech.  History of stroke, on Coumadin..  Review of Systems  Positive: Headache, dizziness Negative: Weakness, nausea, vomiting or chest pain  Physical Exam  BP (!) 161/98 (BP Location: Left Arm)   Pulse (!) 45   Temp 98.7 F (37.1 C) (Oral)   Resp 18   Ht '5\' 3"'$  (1.6 m)   Wt 79.4 kg   SpO2 94%   BMI 31.00 kg/m  Gen:   Awake, no distress.  No facial asymmetry. Resp:  Normal effort  MSK:   Moves extremities without difficulty  Other:    Medical Decision Making  Medically screening exam initiated at 6:26 PM.  Appropriate orders placed.  Dominic Reel. was informed that the remainder of the evaluation will be completed by another provider, this initial triage assessment does not replace that evaluation, and the importance of remaining in the ED until their evaluation is complete.     Duanne Guess, PA-C 01/23/22 1827

## 2022-01-24 DIAGNOSIS — R519 Headache, unspecified: Secondary | ICD-10-CM | POA: Diagnosis not present

## 2022-01-24 NOTE — ED Notes (Signed)
Pt verbalized understanding of discharge instructions, prescriptions, and follow-up care instructions. Pt advised advised if symptoms worsen to return to ED.

## 2022-03-16 ENCOUNTER — Other Ambulatory Visit: Payer: Self-pay | Admitting: Urology

## 2022-03-16 DIAGNOSIS — R972 Elevated prostate specific antigen [PSA]: Secondary | ICD-10-CM

## 2022-04-05 ENCOUNTER — Other Ambulatory Visit: Payer: Self-pay | Admitting: Urology

## 2022-11-17 ENCOUNTER — Other Ambulatory Visit: Payer: Medicare Other

## 2022-11-20 ENCOUNTER — Other Ambulatory Visit: Payer: Self-pay | Admitting: *Deleted

## 2022-11-20 ENCOUNTER — Other Ambulatory Visit: Payer: Medicare Other

## 2022-11-20 DIAGNOSIS — N401 Enlarged prostate with lower urinary tract symptoms: Secondary | ICD-10-CM

## 2022-11-22 IMAGING — CT CT ANGIO HEAD-NECK (W OR W/O PERF)
2 of 7 series · 8 of 33 positions shown · IV contrast (APPLIED)
Comparison: Prior CT from earlier the same day.

CLINICAL DATA: Initial evaluation for acute headache.

EXAM:
CT ANGIOGRAPHY HEAD AND NECK
TECHNIQUE: Multidetector CT imaging of the head and neck was performed using
the standard protocol during bolus administration of intravenous
contrast. Multiplanar CT image reconstructions and MIPs were
obtained to evaluate the vascular anatomy. Carotid stenosis
measurements (when applicable) are obtained utilizing NASCET
criteria, using the distal internal carotid diameter as the
denominator.

[Series 6: cta neck/head · axial · 0.50mm/px · z∈[-194,-72]mm · 2 of 184 slices shown]
[im 62/184  soft-tissue]
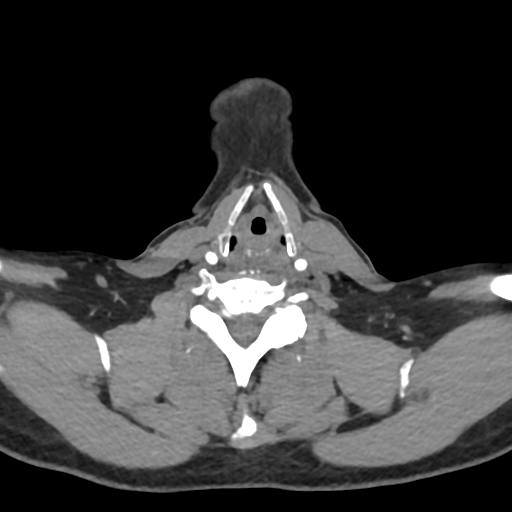
[im 123/184  soft-tissue]
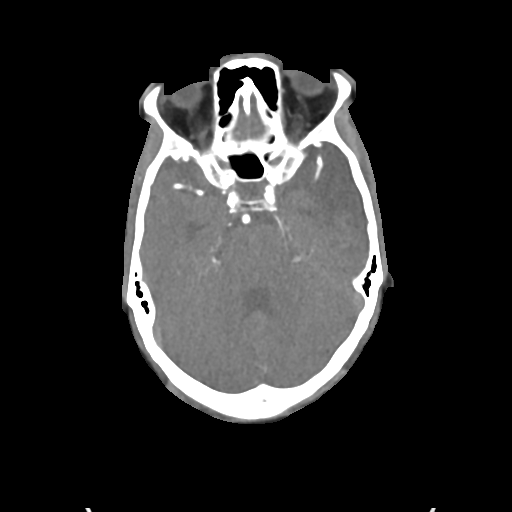

[Series 8: ax thins · axial · 0.39mm/px · z∈[-261,-10]mm · 6 of 353 slices shown]
[im 51/353  soft-tissue]
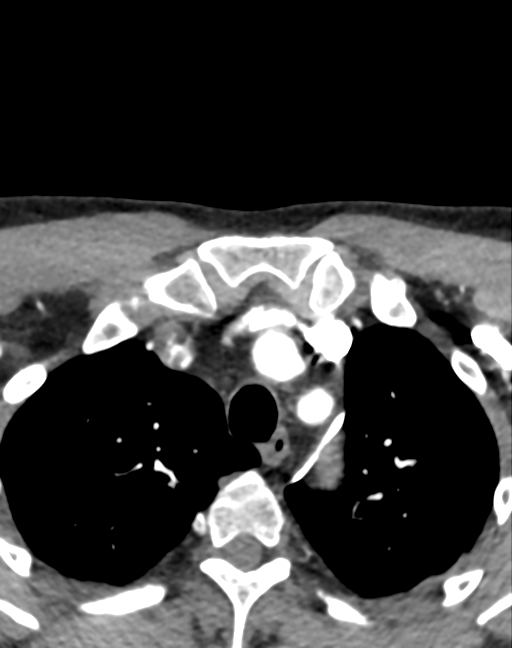
[im 101/353  bone]
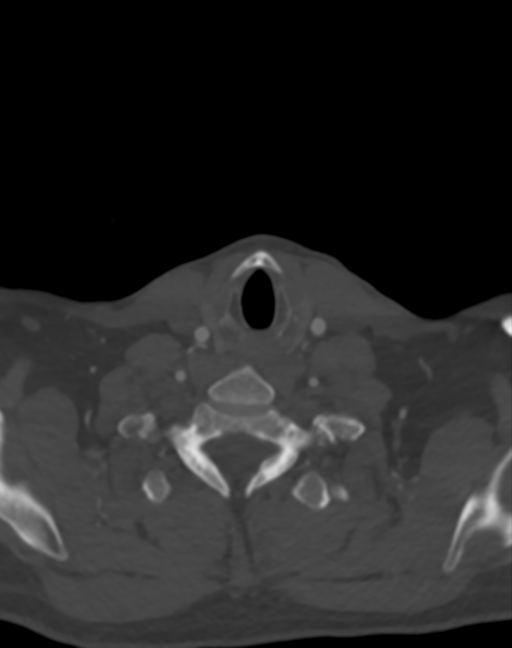
[im 151/353  soft-tissue]
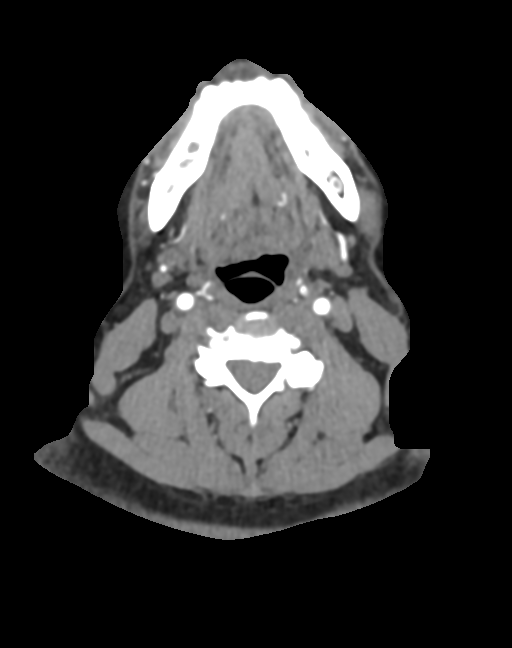
[im 202/353  bone]
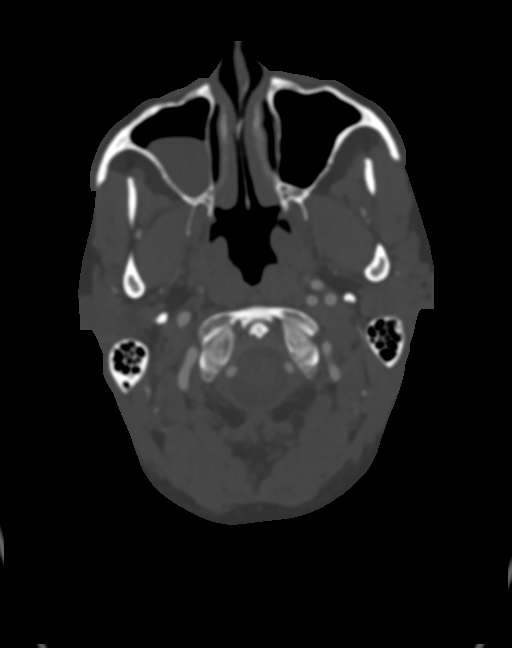
[im 252/353  soft-tissue]
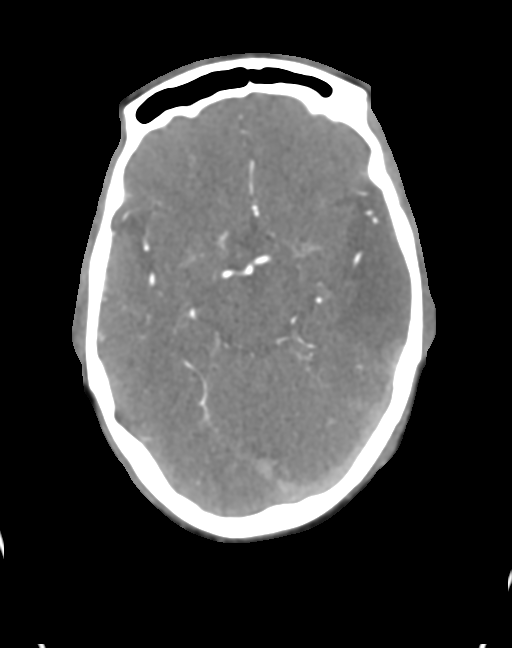
[im 302/353  bone]
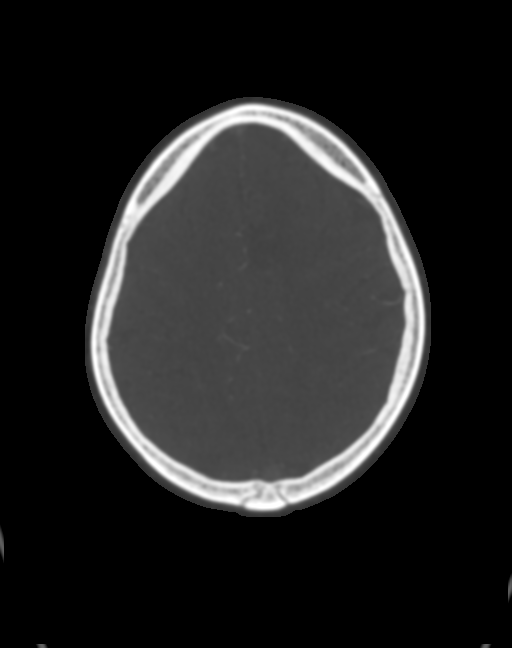

[8 of 33 positions shown; findings below may reference images not displayed]

RADIATION DOSE REDUCTION: This exam was performed according to the
departmental dose-optimization program which includes automated
exposure control, adjustment of the mA and/or kV according to
patient size and/or use of iterative reconstruction technique.

CONTRAST:  75mL OMNIPAQUE IOHEXOL 350 MG/ML SOLN
FINDINGS: CTA NECK FINDINGS

Aortic arch: Visualized intrathoracic aorta normal in caliber
without aneurysm. Standard branching pattern noted. Mild
atheromatous change about the arch itself. No stenosis about the
origin the great vessels.

Right carotid system: Right common and internal carotid arteries
patent without stenosis or dissection.

Left carotid system: Left common and internal carotid arteries
patent without stenosis or dissection.

Vertebral arteries: Both vertebral arteries arise from the
subclavian arteries. No proximal subclavian artery stenosis. Both
vertebral arteries widely patent without stenosis, dissection or
occlusion.

Skeleton: No discrete or worrisome osseous lesions.

Other neck: No other acute soft tissue abnormality within the neck.

Upper chest: Visualized upper chest demonstrates no acute finding.

Review of the MIP images confirms the above findings

CTA HEAD FINDINGS

Anterior circulation: Petrous segments patent bilaterally. Scattered
atheromatous change within the carotid siphons with associated mild
to moderate multifocal narrowing. A1 segments patent. Normal
anterior communicating artery complex. Both anterior cerebral
arteries patent to their distal aspects without significant
stenosis. Normal in stenosis or occlusion. Normal MCA bifurcations.
Right MCA branches widely patent and well perfused. On the left,
prominent calcified atheromatous plaque seen involving proximal left
M2/M3 branches with up to severe stenosis (series 8, images 113,
111). Left MCA branches patent and perfused distally.

Posterior circulation: Atheromatous change within the mid V4
segments with associated mild stenosis on the right and more
moderate stenosis on the left. Both PICA patent. Basilar patent to
its distal aspect without stenosis. Superior cerebellar arteries
patent bilaterally. Both PCAs primarily supplied via the basilar
well perfused or distal aspects.

Venous sinuses: Not well assessed due to timing of the contrast
bolus, but grossly patent.

Anatomic variants: None significant.  No aneurysm.

Review of the MIP images confirms the above findings
IMPRESSION: 1. Negative CTA for large vessel occlusion or other emergent
finding. No intracranial aneurysm.
2. Prominent calcified atheromatous plaque involving proximal left
M2/M3 branches with up to severe stenosis as above.
3. Additional moderate atheromatous change within the carotid
siphons with associated mild to moderate multifocal narrowing.
4. Wide patency of the major arterial vasculature of the neck.

## 2022-11-24 ENCOUNTER — Other Ambulatory Visit: Payer: Medicare Other

## 2022-11-24 DIAGNOSIS — N401 Enlarged prostate with lower urinary tract symptoms: Secondary | ICD-10-CM

## 2022-11-25 ENCOUNTER — Ambulatory Visit: Payer: Medicare Other | Admitting: Urology

## 2022-11-25 LAB — PSA: Prostate Specific Ag, Serum: 2.1 ng/mL (ref 0.0–4.0)

## 2022-11-25 NOTE — Progress Notes (Unsigned)
11/26/22 2:56 PM   Dominic Brooks. 12/12/1950 [id number]  Referring provider:  Derinda Late, MD [phone] [facility] [place],  [place] [address]  Chief Complaint  Patient presents with   Other    Psa     Urological history: 1. BPH with LU TS - PSA (11/2022) 2.1 - tamsulosin 0.4 mg daily and finasteride 5 mg daily   2. ED - contributing factors of age, BPH, HLD, HTN, diabetes and CVA - sildenafil 100 mg, on-demand-dosing   3. Right epididymitis/orchitis -Resolved    HPI: Dominic Brooks. is a 72 y.o.male who presents today for a 1 year follow-up with IPSS, SHIM, and prior PSA.   I PSS 4/2  He stated he had an incident about 2 months ago where he had to use the restroom quite urgently so he pinched his penis with his fingers and then experience gross hematuria when he got to the toilet.  He states he did not have to pinch his penis for more than a few minutes.  He has not had any further incidents of gross heme.  Patient denies any modifying or aggravating factors.  Patient denies any dysuria or suprapubic/flank pain.  Patient denies any fevers, chills, nausea or vomiting.    He is taking the finasteride.   IPSS     Row Name 11/26/22 1400         International Prostate Symptom Score   How often have you had the sensation of not emptying your bladder? Not at All     How often have you had to urinate less than every two hours? Less than half the time     How often have you found you stopped and started again several times when you urinated? Not at All     How often have you found it difficult to postpone urination? Not at All     How often have you had a weak urinary stream? Not at All     How often have you had to strain to start urination? Not at All     How many times did you typically get up at night to urinate? 2 Times     Total IPSS Score 4       Quality of Life due to urinary symptoms   If you were to spend the rest  of your life with your urinary condition just the way it is now how would you feel about that? Mostly Satisfied               Score:  1-7 Mild 8-19 Moderate 20-35 Severe  SHIM 19   Patient still having spontaneous erections.  He denies any pain or curvature with erections.  He is taking Viagra and it is effective.   SHIM     Row Name 11/26/22 1422         SHIM: Over the last 6 months:   How do you rate your confidence that you could get and keep an erection? Moderate     When you had erections with sexual stimulation, how often were your erections hard enough for penetration (entering your partner)? Most Times (much more than half the time)     During sexual intercourse, how often were you able to maintain your erection after you had penetrated (entered) your partner? Most Times (much more than half the time)     During sexual intercourse, how difficult was it to maintain  your erection to completion of intercourse? Slightly Difficult     When you attempted sexual intercourse, how often was it satisfactory for you? Most Times (much more than half the time)       SHIM Total Score   SHIM 19               Score: 1-7 Severe ED 8-11 Moderate ED 12-16 Mild-Moderate ED 17-21 Mild ED 22-25 No ED   PMH: Past Medical History:  Diagnosis Date   A-fib    BPH (benign prostatic hyperplasia)    Chronic kidney disease    stage 3   Colon adenoma    Diverticulosis    Dysrhythmia    Elevated PSA    Erectile dysfunction    Gastric ulcer    History of DVT (deep vein thrombosis)    History of stomach ulcers    Hyperlipidemia    Hyperprolactinemia    Hypertension    Seizures    Stroke    Tubular adenoma of colon    Urinary retention    Vitamin D deficiency     Surgical History: Past Surgical History:  Procedure Laterality Date   COLONOSCOPY     COLONOSCOPY WITH PROPOFOL N/A 03/26/2015   Procedure: COLONOSCOPY WITH PROPOFOL;  Surgeon: Lollie Sails, MD;   Location[facility] ENDOSCOPY;  Service: Endoscopy;  Laterality: N/A;   COLONOSCOPY WITH PROPOFOL N/A 08/30/2018   Procedure: COLONOSCOPY WITH PROPOFOL;  Surgeon: Lollie Sails, MD;  Location[facility] ENDOSCOPY;  Service: Endoscopy;  Laterality: N/A;   SHOULDER SURGERY  1991   thumb surgery  1984    Home Medications:  Allergies as of 11/26/2022       Reactions   Asa [aspirin]         Medication List        Accurate as of November 26, 2022  2:56 PM. If you have any questions, ask your nurse or doctor.          Cholecalciferol 25 MCG (1000 UT) tablet Take 1,000 Units by mouth daily.   finasteride 5 MG tablet Commonly known as: PROSCAR TAKE 1 TABLET DAILY   losartan 25 MG tablet Commonly known as: COZAAR   metoCLOPramide 10 MG tablet Commonly known as: REGLAN Take 1 tablet (10 mg total) by mouth every 6 (six) hours as needed.   naproxen 500 MG tablet Commonly known as: Naprosyn Take 1 tablet (500 mg total) by mouth 2 (two) times daily with a meal.   sildenafil 100 MG tablet Commonly known as: VIAGRA TAKE 1 TABLET DAILY AS NEEDED FOR ERECTILE DYSFUNCTION   simvastatin 40 MG tablet Commonly known as: ZOCOR Take 40 mg by mouth daily.   tamsulosin 0.4 MG Caps capsule Commonly known as: FLOMAX TAKE 1 CAPSULE DAILY   verapamil 240 MG 24 hr capsule Commonly known as: VERELAN PM Take 240 mg by mouth at bedtime.   warfarin 5 MG tablet Commonly known as: COUMADIN Take 5 mg by mouth as directed. Take 5 mg Monday through Thursday   warfarin 2.5 MG tablet Commonly known as: COUMADIN Take 2.5 mg by mouth as directed. Take one tablet every Friday, Saturday, and Sunday        Allergies:  Allergies  Allergen Reactions   Asa [Aspirin]     Family History: Family History  Problem Relation Age of Onset   Diabetes Mother    Hypertension Mother    Heart disease Father    Hypertension Father    Kidney disease  Neg Hx    Prostate cancer Neg Hx     Social History:   reports that he has never smoked. He has never used smokeless tobacco. He reports that he does not currently use alcohol. He reports that he does not use drugs.   Physical Exam: BP (!) 150/83   Pulse 64   Ht 5\' 3"  (1.6 m)   Wt 189 lb 1.6 oz (85.8 kg)   BMI 33.50 kg/m   Constitutional:  Well nourished. Alert and oriented, No acute distress. HEENT: [place] AT, moist mucus membranes.  Trachea midline Cardiovascular: No clubbing, cyanosis, or edema. Respiratory: Normal respiratory effort, no increased work of breathing. GU: No CVA tenderness.  No bladder fullness or masses.  Patient with uncircumcised phallus. Foreskin easily retracted  Urethral meatus is patent.  No penile discharge. No penile lesions or rashes. Scrotum without lesions, cysts, rashes and/or edema.  Testicles are located scrotally bilaterally. No masses are appreciated in the testicles. Left and right epididymis are normal. Rectal: Patient with  normal sphincter tone. Anus and perineum without scarring or rashes. No rectal masses are appreciated. Prostate is approximately 50 + grams, no nodules are appreciated. Seminal vesicles could not be palpated. Neurologic: Grossly intact, no focal deficits, moving all 4 extremities. Psychiatric: Normal mood and affect.   Laboratory Data: Component     Latest Ref Rng 11/24/2022  Prostate Specific Ag, Serum     0.0 - 4.0 ng/mL 2.1    Serum creatinine (06/2022) 1.3 Hemoglobin A1c (06/2022) 6.6 Total cholesterol (06/2022) 151 TSH (06/2022) 2.827    Urinalysis:  Urinalysis w/Microscopic Order: [id number] Component Ref Range & Units 4 mo ago  Color Colorless, Straw, Light Yellow, Yellow, Dark Yellow Light Yellow  Clarity Clear Clear  Specific Gravity 1.005 - 1.030 1.015  pH, Urine 5.0 - 8.0 5.5  Protein, Urinalysis Negative mg/dL Negative  Glucose, Urinalysis Negative mg/dL Negative  Ketones, Urinalysis Negative mg/dL Negative  Blood, Urinalysis Negative Negative  Nitrite,  Urinalysis Negative Negative  Leukocyte Esterase, Urinalysis Negative Negative  Bilirubin, Urinalysis Negative Negative  Urobilinogen, Urinalysis 0.2 - 1.0 mg/dL 0.2  WBC, UA <=5 /hpf 2  Red Blood Cells, Urinalysis <=3 /hpf 1  Bacteria, Urinalysis 0 - 5 /hpf 0-5  Squamous Epithelial Cells, Urinalysis /hpf 0  Resulting Agency [place] - LAB   Specimen Collected: 07/14/22 10:13   Performed by: [place]: 07/14/22 10:36  Received From: [place]  Result Received: 11/20/22 16:15  I have reviewed the labs.   Pertinent Imaging N/A  Assessment & Plan:    1. BPH with LUTS -PSA stable -DRE benign -PVR < 300 cc -continue conservative management, avoiding bladder irritants and timed voiding's -Continue tamsulosin 0.4 mg daily and finasteride 5 mg daily    2. Erectile dysfunction  - Continue sildenafil 100 mg, on-demand dosing  3. Gross hematuria -May be secondary to irritation of the large prostate by pinching the penis and holding urine in place when there is an urge to void or irritation to the penile urethra secondary to the obstructing of urine flow  -To be cautious, we will go ahead and schedule cystoscopy for further evaluation -Explained that the cystoscopy is a safe and common diagnostic test performed by one of our physicians in the office.  It consist of using a thin, lighted tube to look directly inside the bladder, prostate and urethra to evaluate the anatomy.  The procedure is brief, typically taking about 5 minutes. -  This will unable [place] to assess bladder health, diagnose and enlarged prostate, assess which BPH procedure may be most appropriate and rule out other bladder conditions (stricture disease, stones, cancer, etc.)  -Advised the patient that there are no restrictions to eating or drinking prior to the cystoscopy -They can continue to take all of their medications as prescribed -They can drive  themselves to and from the appointment -I explained that during the procedure, the area around the urethra will be cleansed thoroughly, topical anesthetic will be applied to numb your urethra, the thin tube is then gently inserted through the urethra into your bladder while fluid flows through the tube to the bladder to enable better visualization -I explained the procedure is usually not painful, however there may be some discomfort (pinching feeling), and they may feel an urge to urinate, coolness or fullness in the bladder and then the cystoscope is removed -After the cystoscopy, I advised them that they may experience urinary frequency, and infection, hematuria, dysuria which will resolve within 24 to 48 hours -Reviewed red flag signs (fever, bright red blood or blood clots in the urine, abdominal pain or difficulty urinating) and to contact the office immediately or seek treatment in the ED if they should experience any of these -The physician will discuss the results of the cystoscopy at the time of the procedure  -cysto for gross hematuria  Return for cysto for gross heme .  Cheyne Bungert, [place] [address] [place], [place] [address] [phone]

## 2022-11-26 ENCOUNTER — Encounter: Payer: Self-pay | Admitting: Urology

## 2022-11-26 ENCOUNTER — Ambulatory Visit (INDEPENDENT_AMBULATORY_CARE_PROVIDER_SITE_OTHER): Payer: Medicare Other | Admitting: Urology

## 2022-11-26 VITALS — BP 148/87 | HR 59 | Ht 63.0 in | Wt 189.1 lb

## 2022-11-26 DIAGNOSIS — N529 Male erectile dysfunction, unspecified: Secondary | ICD-10-CM

## 2022-11-26 DIAGNOSIS — R310 Gross hematuria: Secondary | ICD-10-CM | POA: Diagnosis not present

## 2022-11-26 DIAGNOSIS — N138 Other obstructive and reflux uropathy: Secondary | ICD-10-CM

## 2022-11-26 DIAGNOSIS — N401 Enlarged prostate with lower urinary tract symptoms: Secondary | ICD-10-CM | POA: Diagnosis not present

## 2022-12-18 ENCOUNTER — Ambulatory Visit (INDEPENDENT_AMBULATORY_CARE_PROVIDER_SITE_OTHER): Payer: Medicare Other | Admitting: Urology

## 2022-12-18 VITALS — BP 140/79 | HR 73 | Ht 63.0 in | Wt 185.0 lb

## 2022-12-18 DIAGNOSIS — R310 Gross hematuria: Secondary | ICD-10-CM | POA: Diagnosis not present

## 2022-12-18 LAB — MICROSCOPIC EXAMINATION

## 2022-12-18 LAB — URINALYSIS, COMPLETE
Bilirubin, UA: NEGATIVE
Glucose, UA: NEGATIVE
Ketones, UA: NEGATIVE
Leukocytes,UA: NEGATIVE
Nitrite, UA: NEGATIVE
Specific Gravity, UA: 1.02 (ref 1.005–1.030)
Urobilinogen, Ur: 0.2 mg/dL (ref 0.2–1.0)
pH, UA: 5.5 (ref 5.0–7.5)

## 2022-12-18 NOTE — Progress Notes (Signed)
   12/18/22  CC:  Chief Complaint  Patient presents with   Cysto    HPI: Refer to [facility]'s note 11/26/22.  Denies recurrent urethral bleeding  See rooming tab for vitals NED. A&Ox3.   No respiratory distress   Abd soft, NT, ND Normal phallus with bilateral descended testicles  Cystoscopy Procedure Note  Patient identification was confirmed, informed consent was obtained, and patient was prepped using Betadine solution.  Lidocaine jelly was administered per urethral meatus.     Pre-Procedure: - Inspection reveals a normal caliber urethral meatus.  Procedure: The flexible cystoscope was introduced without difficulty - No urethral strictures/lesions are present. -Moderate lateral lobe enlargement prostate with hypervascularity -  Mild elevation  bladder neck - Bilateral ureteral orifices identified - Bladder mucosa  reveals no ulcers, tumors, or lesions - No bladder stones - No trabeculation  Retroflexion shows small intravesical median lobe   Post-Procedure: - Patient tolerated the procedure well  Assessment/ Plan: No abnormalities noted on cystoscopy Keep scheduled follow-up with Michiel Cowboy October 2025    Riki Altes, MD

## 2022-12-20 ENCOUNTER — Encounter: Payer: Self-pay | Admitting: Urology

## 2023-02-12 ENCOUNTER — Other Ambulatory Visit: Payer: Self-pay | Admitting: Urology

## 2023-02-12 DIAGNOSIS — R972 Elevated prostate specific antigen [PSA]: Secondary | ICD-10-CM

## 2023-03-19 ENCOUNTER — Other Ambulatory Visit: Payer: Self-pay | Admitting: Urology

## 2023-06-26 ENCOUNTER — Emergency Department
Admission: EM | Admit: 2023-06-26 | Discharge: 2023-06-26 | Disposition: A | Discharge status: Home / Self Care | Payer: Medicare Other | Attending: Emergency Medicine | Admitting: Emergency Medicine

## 2023-06-26 ENCOUNTER — Emergency Department: Payer: Medicare Other

## 2023-06-26 ENCOUNTER — Other Ambulatory Visit: Payer: Self-pay

## 2023-06-26 DIAGNOSIS — N451 Epididymitis: Secondary | ICD-10-CM

## 2023-06-26 DIAGNOSIS — I10 Essential (primary) hypertension: Secondary | ICD-10-CM | POA: Insufficient documentation

## 2023-06-26 DIAGNOSIS — Z7901 Long term (current) use of anticoagulants: Secondary | ICD-10-CM | POA: Insufficient documentation

## 2023-06-26 DIAGNOSIS — N50812 Left testicular pain: Secondary | ICD-10-CM

## 2023-06-26 DIAGNOSIS — N5089 Other specified disorders of the male genital organs: Secondary | ICD-10-CM

## 2023-06-26 LAB — URINALYSIS, ROUTINE W REFLEX MICROSCOPIC
Bacteria, UA: NONE SEEN
Bilirubin Urine: NEGATIVE
Glucose, UA: NEGATIVE mg/dL
Hgb urine dipstick: NEGATIVE
Ketones, ur: NEGATIVE mg/dL
Nitrite: NEGATIVE
Protein, ur: NEGATIVE mg/dL
Specific Gravity, Urine: 1.01 (ref 1.005–1.030)
pH: 5 (ref 5.0–8.0)

## 2023-06-26 MED ORDER — LEVOFLOXACIN 500 MG PO TABS: 500.0000 mg | ORAL_TABLET | Freq: Once | ORAL | Status: DC

## 2023-06-26 MED ORDER — LEVOFLOXACIN 750 MG PO TABS
750.0000 mg | ORAL_TABLET | Freq: Once | ORAL | Status: AC
  Administered 2023-06-26: 750 mg via ORAL
  Filled 2023-06-26: qty 1

## 2023-06-26 MED ORDER — LEVOFLOXACIN 750 MG PO TABS
750.0000 mg | ORAL_TABLET | Freq: Every day | ORAL | 0 refills | Status: AC
Start: 2023-06-27 — End: 2023-07-06

## 2023-06-26 NOTE — ED Notes (Signed)
First nurse note: L testicle pain started 1 week ago.

## 2023-06-26 NOTE — ED Provider Notes (Signed)
[facility] Emergency Department Provider Note     Event Date/Time   First MD Initiated Contact with Patient 06/26/23 1636     (approximate)   History   Testicle Pain   HPI  Dominic Brooks. is a 72 y.o. male with a history of erectile dysfunction, elevated PSA with PSH, HTN, HLD, and A-fib on Coumadin, presents to the ED, for evaluation of a 1 week duration of left testicular pain.  Patient denies any known injury, trauma, but does note symptoms worsened after lifting furniture.  He reports intermittent swelling to the left testicular region, that comes and goes.  He denies any urinary symptoms including hematuria, dysuria, penile discharge, or urinary retention.  No fevers, chills, abdominal pain noted.  He reports no concern for STD/STI.  Physical Exam   Triage Vital Signs: ED Triage Vitals  Encounter Vitals Group     BP 06/26/23 1359 139/75     Systolic BP Percentile --      Diastolic BP Percentile --      Pulse Rate 06/26/23 1359 77     Resp 06/26/23 1359 18     Temp 06/26/23 1359 97.6 F (36.4 C)     Temp src --      SpO2 06/26/23 1359 93 %     Weight 06/26/23 1402 187 lb (84.8 kg)     Height --      Head Circumference --      Peak Flow --      Pain Score 06/26/23 1401 7     Pain Loc --      Pain Education --      Exclude from Growth Chart --     Most recent vital signs: Vitals:   06/26/23 2030 06/26/23 2050  BP: (!) 157/75 (!) 157/75  Pulse:  79  Resp:  18  Temp:    SpO2:  100%    General Awake, no distress. NAD CV:  Good peripheral perfusion. RRR RESP:  Normal effort. CTA ABD:  No distention. soft GU:  Normal external genitalia.  Patient with tenderness to palpation along the left inguinal canal.  Fullness and tenderness to the left testis in the anterior lateral region concerning for epididymitis.   ED Results / Procedures / Treatments   Labs (all labs ordered are listed, but only abnormal results are displayed) Labs  Reviewed  URINALYSIS, ROUTINE W REFLEX MICROSCOPIC - Abnormal; Notable for the following components:      Result Value   Color, Urine STRAW (*)    APPearance CLEAR (*)    Leukocytes,Ua TRACE (*)    All other components within normal limits  URINE CULTURE     EKG   RADIOLOGY  I personally viewed and evaluated these images as part of my medical decision making, as well as reviewing the written report by the radiologist.  ED Provider Interpretation: Acute left epididymitis  US SCROTUM W/DOPPLER  Result Date: 06/26/2023 CLINICAL DATA:  Left testicular pain and swelling EXAM: SCROTAL ULTRASOUND DOPPLER ULTRASOUND OF THE TESTICLES TECHNIQUE: Complete ultrasound examination of the testicles, epididymis, and other scrotal structures was performed. Color and spectral Doppler ultrasound were also utilized to evaluate blood flow to the testicles. COMPARISON:  10/24/2020 FINDINGS: Right testicle Measurements: 3.0 x 1.8 x 2.2 cm. No mass or microlithiasis visualized. Left testicle Measurements: 3.5 x 1.9 x 2.8 cm. No mass or microlithiasis visualized. Right epididymis:  Normal in size and appearance Left epididymis: 9 mm cyst in  the epididymal head. Prominent and hypervascular epididymal tail concerning for epididymitis. Hydrocele:  None visualized. Varicocele:  None visualized. Pulsed Doppler interrogation of both testes demonstrates normal low resistance arterial and venous waveforms bilaterally. IMPRESSION: Enlargement and hyperemia of the left epididymal tail compatible with epididymitis. No testicular abnormality or evidence of torsion. Electronically Signed   By: Charlett Nose M.D.   On: 06/26/2023 19:38     PROCEDURES:  Critical Care performed: No  Procedures   MEDICATIONS ORDERED IN ED: Medications  levofloxacin (LEVAQUIN) tablet 750 mg (750 mg Oral Given 06/26/23 2054)     IMPRESSION / MDM / ASSESSMENT AND PLAN / ED COURSE  I reviewed the triage vital signs and the nursing notes.                               Differential diagnosis includes, but is not limited to, inguinal hernia, epididymitis, hydrocele, varicocele, testicular torsion, inguinal strain, epididymal cyst  Patient's presentation is most consistent with acute complicated illness / injury requiring diagnostic workup.  Patient's diagnosis is consistent with epididymitis. Patient will be discharged home with prescriptions for Levaquin. Patient is to follow up with urology as needed or otherwise directed. Patient is given ED precautions to return to the ED for any worsening or new symptoms.   FINAL CLINICAL IMPRESSION(S) / ED DIAGNOSES   Final diagnoses:  Epididymitis     Rx / DC Orders   ED Discharge Orders          Ordered    levofloxacin (LEVAQUIN) 750 MG tablet  Daily        06/26/23 2029             Note:  This document was prepared using Dragon voice recognition software and may include unintentional dictation errors.    Lissa Hoard, PA-C 06/26/23 2133    Chesley Noon, MD 06/27/23 1110

## 2023-06-26 NOTE — ED Notes (Signed)
Defer physical exam to PA-C

## 2023-06-26 NOTE — ED Triage Notes (Signed)
Pt complains of left testicle pain x 1 week. Denies any injury but states got worse after lifting furniture. Pt states swelling come and goes. Denies any urinary complaints.

## 2023-06-26 NOTE — ED Notes (Signed)
Patient ambulated to hallway bathroom with a steady gait. 

## 2023-06-26 NOTE — Discharge Instructions (Addendum)
Take the prescription med daily as directed. Follow-up with [facility] as needed.

## 2023-06-28 LAB — URINE CULTURE
Culture: <10000 — AB
Special Requests: NORMAL

## 2023-07-05 ENCOUNTER — Other Ambulatory Visit: Payer: Self-pay

## 2023-07-05 ENCOUNTER — Emergency Department: Payer: Medicare Other

## 2023-07-05 ENCOUNTER — Emergency Department
Admission: EM | Admit: 2023-07-05 | Discharge: 2023-07-05 | Disposition: A | Discharge status: Home / Self Care | Payer: Medicare Other | Attending: Emergency Medicine | Admitting: Emergency Medicine

## 2023-07-05 DIAGNOSIS — N451 Epididymitis: Secondary | ICD-10-CM | POA: Diagnosis not present

## 2023-07-05 DIAGNOSIS — N454 Abscess of epididymis or testis: Secondary | ICD-10-CM | POA: Diagnosis not present

## 2023-07-05 DIAGNOSIS — N50812 Left testicular pain: Secondary | ICD-10-CM | POA: Diagnosis present

## 2023-07-05 DIAGNOSIS — I10 Essential (primary) hypertension: Secondary | ICD-10-CM | POA: Diagnosis not present

## 2023-07-05 LAB — URINALYSIS, ROUTINE W REFLEX MICROSCOPIC
Bacteria, UA: NONE SEEN
Bilirubin Urine: NEGATIVE
Glucose, UA: NEGATIVE mg/dL
Hgb urine dipstick: NEGATIVE
Ketones, ur: NEGATIVE mg/dL
Leukocytes,Ua: NEGATIVE
Nitrite: NEGATIVE
Protein, ur: 30 mg/dL — AB
Specific Gravity, Urine: 1.017 (ref 1.005–1.030)
Squamous Epithelial / HPF: 0 /[HPF] (ref 0–5)
pH: 5 (ref 5.0–8.0)

## 2023-07-05 LAB — COMPREHENSIVE METABOLIC PANEL
ALT: 19 U/L (ref 0–44)
AST: 22 U/L (ref 15–41)
Albumin: 3.8 g/dL (ref 3.5–5.0)
Alkaline Phosphatase: 66 U/L (ref 38–126)
Anion gap: 8 (ref 5–15)
BUN: 18 mg/dL (ref 8–23)
CO2: 22 mmol/L (ref 22–32)
Calcium: 8.4 mg/dL — ABNORMAL LOW (ref 8.9–10.3)
Chloride: 105 mmol/L (ref 98–111)
Creatinine, Ser: 1.56 mg/dL — ABNORMAL HIGH (ref 0.61–1.24)
GFR, Estimated: 47 mL/min — ABNORMAL LOW (ref >60)
Glucose, Bld: 114 mg/dL — ABNORMAL HIGH (ref 70–99)
Potassium: 3.6 mmol/L (ref 3.5–5.1)
Sodium: 135 mmol/L (ref 135–145)
Total Bilirubin: 0.9 mg/dL (ref <1.2)
Total Protein: 7 g/dL (ref 6.5–8.1)

## 2023-07-05 LAB — CBC WITH DIFFERENTIAL/PLATELET
Abs Immature Granulocytes: 0.02 10*3/uL (ref 0.00–0.07)
Basophils Absolute: 0 10*3/uL (ref 0.0–0.1)
Basophils Relative: 0 %
Eosinophils Absolute: 0 10*3/uL (ref 0.0–0.5)
Eosinophils Relative: 0 %
HCT: 41.7 % (ref 39.0–52.0)
Hemoglobin: 14.7 g/dL (ref 13.0–17.0)
Immature Granulocytes: 0 %
Lymphocytes Relative: 32 %
Lymphs Abs: 1.7 10*3/uL (ref 0.7–4.0)
MCH: 32.7 pg (ref 26.0–34.0)
MCHC: 35.3 g/dL (ref 30.0–36.0)
MCV: 92.9 fL (ref 80.0–100.0)
Monocytes Absolute: 0.6 10*3/uL (ref 0.1–1.0)
Monocytes Relative: 12 %
Neutro Abs: 3 10*3/uL (ref 1.7–7.7)
Neutrophils Relative %: 56 %
Platelets: 231 10*3/uL (ref 150–400)
RBC: 4.49 MIL/uL (ref 4.22–5.81)
RDW: 13.3 % (ref 11.5–15.5)
WBC: 5.4 10*3/uL (ref 4.0–10.5)
nRBC: 0 % (ref 0.0–0.2)

## 2023-07-05 LAB — LACTIC ACID, PLASMA: Lactic Acid, Venous: 1.2 mmol/L (ref 0.5–1.9)

## 2023-07-05 MED ORDER — DOXYCYCLINE HYCLATE 100 MG PO CAPS: 100.0000 mg | ORAL_CAPSULE | Freq: Two times a day (BID) | ORAL | 0 refills | Status: AC

## 2023-07-05 NOTE — ED Notes (Signed)
See triage note  States he was seen about 8 days ago for pain and swelling to testicle  States the swelling went away but the pain is back   no fever

## 2023-07-05 NOTE — ED Provider Notes (Signed)
[facility] Provider Note    Event Date/Time   First MD Initiated Contact with Patient 07/05/23 1020     (approximate)   History   Testicle Pain   HPI  Dominic Brooks. is a 72 y.o. male   presents to the ED with complaint of pain to his left testicle.  Patient states that he was seen in the emergency department on 06/26/2023 for the same symptoms.  Patient states that swelling has improved but pain is still there.  He was given Levaquin 750 mg and will be on his last dose today.  Patient reports that pain is worse with lying down.  He denies any fever, chills, nausea or vomiting.  No urinary symptoms or difficulty with urination.  Patient has history of hypertension, A-fib, BPH, DVT, elevated PSA, erectile dysfunction.      Physical Exam   Triage Vital Signs: ED Triage Vitals [07/05/23 1006]  Encounter Vitals Group     BP (!) 155/77     Systolic BP Percentile      Diastolic BP Percentile      Pulse Rate 60     Resp 18     Temp 98 F (36.7 C)     Temp src      SpO2 96 %     Weight 188 lb (85.3 kg)     Height 5\' 3"  (1.6 m)     Head Circumference      Peak Flow      Pain Score 8     Pain Loc      Pain Education      Exclude from Growth Chart     Most recent vital signs: Vitals:   07/05/23 1006 07/05/23 1418  BP: (!) 155/77 (!) 150/70  Pulse: 60 68  Resp: 18 19  Temp: 98 F (36.7 C) 98 F (36.7 C)  SpO2: 96% 97%     General: Awake, no distress.  CV:  Good peripheral perfusion.  Resp:  Normal effort.  Abd:  No distention.  Other:     ED Results / Procedures / Treatments   Labs (all labs ordered are listed, but only abnormal results are displayed) Labs Reviewed  URINALYSIS, ROUTINE W REFLEX MICROSCOPIC - Abnormal; Notable for the following components:      Result Value   Color, Urine YELLOW (*)    APPearance CLEAR (*)    Protein, ur 30 (*)    All other components within normal limits  COMPREHENSIVE METABOLIC PANEL -  Abnormal; Notable for the following components:   Glucose, Bld 114 (*)    Creatinine, Ser 1.56 (*)    Calcium 8.4 (*)    GFR, Estimated 47 (*)    All other components within normal limits  CBC WITH DIFFERENTIAL/PLATELET  LACTIC ACID, PLASMA  LACTIC ACID, PLASMA     RADIOLOGY Ultrasound scrotum with Doppler per radiology  Fluid collection with surrounding hyperemia in the left  epididymal tail is concerning for epididymitis complicated by  abscess formation.    PROCEDURES:  Critical Care performed:   Procedures   MEDICATIONS ORDERED IN ED: Medications - No data to display   IMPRESSION / MDM / ASSESSMENT AND PLAN / ED COURSE  I reviewed the triage vital signs and the nursing notes.   Differential diagnosis includes, but is not limited to, epididymitis unresolved, abscess, torsion, urinary tract infection, cellulitis.  72 year old male presents to the ED with complaint of left sided scrotal pain while taking  Levaquin 750 mg for the last 9 days.  Patient reports that he was seen in the emergency department at which time he was diagnosed with epididymitis.  Patient ultrasound today shows epididymitis with fluid collection concerning for an abscess.  I contacted Dr. Lonna Cobb who is on-call for urology who advised to have patient started on doxycycline and will arrange follow-up in the office.  Patient is agreeable with this plan.  Patient is return to the ED if any severe worsening of his symptoms involving fever or chills.      Patient's presentation is most consistent with acute illness / injury with system symptoms.  FINAL CLINICAL IMPRESSION(S) / ED DIAGNOSES   Final diagnoses:  Epididymitis, left  Epididymal abscess     Rx / DC Orders   ED Discharge Orders          Ordered    doxycycline (VIBRAMYCIN) 100 MG capsule  2 times daily        07/05/23 1424             Note:  This document was prepared using Dragon voice recognition software and may include  unintentional dictation errors.   Tommi Rumps, PA-C 07/05/23 1500    Janith Lima, MD 07/05/23 [phone]

## 2023-07-05 NOTE — Discharge Instructions (Signed)
Call Monday to make an appointment with Dr. Lonna Cobb who is on-call for urology.  Finish the Levaquin that you have today and also start the doxycycline twice daily until you have seen Dr. Lonna Cobb.

## 2023-07-05 NOTE — ED Triage Notes (Signed)
Pt comes with c/o testicle pain to left side. Pt states this started 11-1 with pain and swelling. Pt states swelling is gone but pain still there. Pt states he was given meds and today is last dose of that. Pt states pain is worse when laying down. Pt states it is twisting pain.

## 2023-07-08 NOTE — Progress Notes (Signed)
07/10/23 11:24 AM   Dominic Brooks. 04/22/1951 [id number]  Referring provider:  Barbette Reichmann, MD [address] [facility] [place],  [place] [address]  Urological history: 1. BPH with LU TS - PSA (11/2022) 2.1 - tamsulosin 0.4 mg daily and finasteride 5 mg daily   2. ED - contributing factors of age, BPH, HLD, HTN, diabetes and CVA - sildenafil 100 mg, on-demand-dosing   3. Right epididymitis/orchitis -Resolved  4. High risk hematuria -non smoker -cysto (11/2022) - hypervascularity of prostate   HPI: Dominic Brooks. is a 72 y.o.male who presents today for follow up.   Previous records reviewed.   He was seen in the ED on 06/26/2023 for left epididymitis.  Scrotal ultrasound performed on November 1 noted enlargement and hyperemia of the left epididymal tail compatible with epididymitis.  He was placed on Levaquin 750 mg daily.   He returned to the ED on July 05, 2023 stating the swelling had decreased but the pain was still present.  Scrotal ultrasound was repeated and it noted a fluid collection surrounding the left epididymal tail which was concerning for abscess formation.  The ED reached out to Dr. Lonna Cobb who was on-call and he advised them to start Mr. Sanghera on doxycycline and to follow-up in the office with [place].  Urine culture in the ED grew out insignificant colonies.  3 days into taking the doxycycline, he states the pain subsided.  Patient denies any modifying or aggravating factors.  Patient denies any recent UTI's, gross hematuria, dysuria or suprapubic/flank pain.  Patient denies any fevers, chills, nausea or vomiting.     PMH: Past Medical History:  Diagnosis Date   A-fib (HCC)    BPH (benign prostatic hyperplasia)    Chronic kidney disease    stage 3   Colon adenoma    Diverticulosis    Dysrhythmia    Elevated PSA    Erectile dysfunction    Gastric ulcer    History of DVT (deep vein thrombosis)    History of stomach ulcers     Hyperlipidemia    Hyperprolactinemia (HCC)    Hypertension    Seizures (HCC)    Stroke (HCC)    Tubular adenoma of colon    Urinary retention    Vitamin D deficiency     Surgical History: Past Surgical History:  Procedure Laterality Date   COLONOSCOPY     COLONOSCOPY WITH PROPOFOL N/A 03/26/2015   Procedure: COLONOSCOPY WITH PROPOFOL;  Surgeon: Christena Deem, MD;  [facility] ENDOSCOPY;  Service: Endoscopy;  Laterality: N/A;   COLONOSCOPY WITH PROPOFOL N/A 08/30/2018   Procedure: COLONOSCOPY WITH PROPOFOL;  Surgeon: Christena Deem, MD;  Location: [facility] ENDOSCOPY;  Service: Endoscopy;  Laterality: N/A;   SHOULDER SURGERY  1991   thumb surgery  1984    Home Medications:  Allergies as of 07/10/2023       Reactions   Asa [aspirin]    History of stomach ulcers        Medication List        Accurate as of July 10, 2023 11:24 AM. If you have any questions, ask your nurse or doctor.          STOP taking these medications    Cholecalciferol 25 MCG (1000 UT) tablet Stopped by: Deziray Nabi   losartan 25 MG tablet Commonly known as: COZAAR Stopped by: Tamlyn Sides   metoCLOPramide 10 MG tablet Commonly known as: REGLAN Stopped by: Michiel Cowboy  naproxen 500 MG tablet Commonly known as: Naprosyn Stopped by: Carollee Herter Larraine Argo       TAKE these medications    doxycycline 100 MG capsule Commonly known as: VIBRAMYCIN Take 1 capsule (100 mg total) by mouth 2 (two) times daily.   finasteride 5 MG tablet Commonly known as: PROSCAR TAKE 1 TABLET DAILY   sildenafil 100 MG tablet Commonly known as: VIAGRA TAKE 1 TABLET DAILY AS NEEDED FOR ERECTILE DYSFUNCTION   simvastatin 40 MG tablet Commonly known as: ZOCOR Take 40 mg by mouth daily.   tamsulosin 0.4 MG Caps capsule Commonly known as: FLOMAX TAKE 1 CAPSULE DAILY   verapamil 240 MG 24 hr capsule Commonly known as: VERELAN Take 240 mg by mouth at bedtime.   warfarin 5 MG  tablet Commonly known as: COUMADIN Take 5 mg by mouth as directed. Take 5 mg Monday through Thursday   warfarin 2.5 MG tablet Commonly known as: COUMADIN Take 2.5 mg by mouth as directed. Take one tablet every Friday, Saturday, and Sunday        Allergies:  Allergies  Allergen Reactions   Asa [Aspirin]     History of stomach ulcers    Family History: Family History  Problem Relation Age of Onset   Diabetes Mother    Hypertension Mother    Heart disease Father    Hypertension Father    Kidney disease Neg Hx    Prostate cancer Neg Hx     Social History:  reports that he has never smoked. He has never been exposed to tobacco smoke. He has never used smokeless tobacco. He reports that he does not currently use alcohol. He reports that he does not use drugs.   Physical Exam: BP 128/84   Pulse 79   Ht 5\' 3"  (1.6 m)   Wt 188 lb (85.3 kg)   BMI 33.30 kg/m   Constitutional:  Well nourished. Alert and oriented, No acute distress. HEENT: [place] AT, moist mucus membranes.  Trachea midline Cardiovascular: No clubbing, cyanosis, or edema. GU: No CVA tenderness.  No bladder fullness or masses.  Patient with uncircumcised phallus.   Scrotum without lesions, cysts, rashes and/or edema.  Testicles are located scrotally bilaterally. No masses are appreciated in the testicles. Right epididymis is normal.  Left epididymis is still indurated, but mildly tender.  No erythema, fluctuance or crepitus is noted. Neurologic: Grossly intact, no focal deficits, moving all 4 extremities. Psychiatric: Normal mood and affect.   Laboratory Data: Results for orders placed or performed during the hospital encounter of 07/05/23  Urinalysis, Routine w reflex microscopic -Urine, Random  Result Value Ref Range   Color, Urine YELLOW (A) YELLOW   APPearance CLEAR (A) CLEAR   Specific Gravity, Urine 1.017 1.005 - 1.030   pH 5.0 5.0 - 8.0   Glucose, UA NEGATIVE NEGATIVE mg/dL   Hgb urine dipstick NEGATIVE  NEGATIVE   Bilirubin Urine NEGATIVE NEGATIVE   Ketones, ur NEGATIVE NEGATIVE mg/dL   Protein, ur 30 (A) NEGATIVE mg/dL   Nitrite NEGATIVE NEGATIVE   Leukocytes,Ua NEGATIVE NEGATIVE   RBC / HPF 0-5 0 - 5 RBC/hpf   WBC, UA 0-5 0 - 5 WBC/hpf   Bacteria, UA NONE SEEN NONE SEEN   Squamous Epithelial / HPF 0 0 - 5 /HPF   Mucus PRESENT   Comprehensive metabolic panel  Result Value Ref Range   Sodium 135 135 - 145 mmol/L   Potassium 3.6 3.5 - 5.1 mmol/L   Chloride 105 98 - 111  mmol/L   CO2 22 22 - 32 mmol/L   Glucose, Bld 114 (H) 70 - 99 mg/dL   BUN 18 8 - 23 mg/dL   Creatinine, Ser 9.56 (H) 0.61 - 1.24 mg/dL   Calcium 8.4 (L) 8.9 - 10.3 mg/dL   Total Protein 7.0 6.5 - 8.1 g/dL   Albumin 3.8 3.5 - 5.0 g/dL   AST 22 15 - 41 U/L   ALT 19 0 - 44 U/L   Alkaline Phosphatase 66 38 - 126 U/L   Total Bilirubin 0.9 <1.2 mg/dL   GFR, Estimated 47 (L) >60 mL/min   Anion gap 8 5 - 15  CBC with Differential  Result Value Ref Range   WBC 5.4 4.0 - 10.5 K/uL   RBC 4.49 4.22 - 5.81 MIL/uL   Hemoglobin 14.7 13.0 - 17.0 g/dL   HCT 38.7 56.4 - 33.2 %   MCV 92.9 80.0 - 100.0 fL   MCH 32.7 26.0 - 34.0 pg   MCHC 35.3 30.0 - 36.0 g/dL   RDW 95.1 88.4 - 16.6 %   Platelets 231 150 - 400 K/uL   nRBC 0.0 0.0 - 0.2 %   Neutrophils Relative % 56 %   Neutro Abs 3.0 1.7 - 7.7 K/uL   Lymphocytes Relative 32 %   Lymphs Abs 1.7 0.7 - 4.0 K/uL   Monocytes Relative 12 %   Monocytes Absolute 0.6 0.1 - 1.0 K/uL   Eosinophils Relative 0 %   Eosinophils Absolute 0.0 0.0 - 0.5 K/uL   Basophils Relative 0 %   Basophils Absolute 0.0 0.0 - 0.1 K/uL   Immature Granulocytes 0 %   Abs Immature Granulocytes 0.02 0.00 - 0.07 K/uL  Lactic acid, plasma  Result Value Ref Range   Lactic Acid, Venous 1.2 0.5 - 1.9 mmol/L  I have reviewed the labs.   Pertinent Imaging CLINICAL DATA:  Left testicular pain and swelling   EXAM: SCROTAL ULTRASOUND   DOPPLER ULTRASOUND OF THE TESTICLES   TECHNIQUE: Complete  ultrasound examination of the testicles, epididymis, and other scrotal structures was performed. Color and spectral Doppler ultrasound were also utilized to evaluate blood flow to the testicles.   COMPARISON:  10/24/2020   FINDINGS: Right testicle   Measurements: 3.0 x 1.8 x 2.2 cm. No mass or microlithiasis visualized.   Left testicle   Measurements: 3.5 x 1.9 x 2.8 cm. No mass or microlithiasis visualized.   Right epididymis:  Normal in size and appearance   Left epididymis: 9 mm cyst in the epididymal head. Prominent and hypervascular epididymal tail concerning for epididymitis.   Hydrocele:  None visualized.   Varicocele:  None visualized.   Pulsed Doppler interrogation of both testes demonstrates normal low resistance arterial and venous waveforms bilaterally.   IMPRESSION: Enlargement and hyperemia of the left epididymal tail compatible with epididymitis.   No testicular abnormality or evidence of torsion.     Electronically Signed   By: Charlett Nose M.D.   On: 06/26/2023 19:38  CLINICAL DATA:  Pain since 06/26/2023. The patient reports taking a course of antibiotics.   EXAM: SCROTAL ULTRASOUND   DOPPLER ULTRASOUND OF THE TESTICLES   TECHNIQUE: Complete ultrasound examination of the testicles, epididymis, and other scrotal structures was performed. Color and spectral Doppler ultrasound were also utilized to evaluate blood flow to the testicles.   COMPARISON:  Scrotal ultrasound dated 06/26/2023.   FINDINGS: Right testicle   Measurements: 3.3 x 1.7 x 1.9 cm. No mass or microlithiasis visualized.  Left testicle   Measurements: 3.4 x 1.9 x 2.6 cm. No mass or microlithiasis visualized.   Right epididymis:  Normal in size and appearance.   Left epididymis: A fluid collection with surrounding hyperemia in the left epididymal tail measures 0.8 x 0.5 cm. Epididymal head cysts measure up to 0.9 cm in size.   Hydrocele:  None visualized.    Varicocele:  None visualized.   Pulsed Doppler interrogation of both testes demonstrates normal low resistance arterial and venous waveforms bilaterally.   Other: A reducible fat containing right inguinal hernia is noted.   IMPRESSION: 1. Fluid collection with surrounding hyperemia in the left epididymal tail is concerning for epididymitis complicated by abscess formation.     Electronically Signed   By: Romona Curls M.D.   On: 07/05/2023 11:53 I have independently reviewed the films.  See HPI.    Assessment & Plan:    1. Left epididymis -No sign of abscess on today's exam -Symptoms are resolving well on the doxycycline -Advised patient to complete the entire prescription even if he starts to feel that his symptoms have completely abated -Follow-up in 1 month to ensure resolution  2. BPH with LUTS -PSA screening up to date -Continue tamsulosin 0.4 mg daily and finasteride 5 mg daily    3. Erectile dysfunction  - Continue sildenafil 100 mg, on-demand dosing -Patient asked for refill today and I have sent it to his express prescription pharmacy  4. Gross hematuria -Secondary to hypervascular prostate -No complaints of gross hematuria  Return in about 1 month (around 08/09/2023) for symptom recheck .  Cloretta Ned   [facility] [address], [address] [place], [place] [address] [phone]

## 2023-07-10 ENCOUNTER — Encounter: Payer: Self-pay | Admitting: Urology

## 2023-07-10 ENCOUNTER — Ambulatory Visit: Payer: Medicare Other | Admitting: Urology

## 2023-07-10 VITALS — BP 128/84 | HR 79 | Ht 63.0 in | Wt 188.0 lb

## 2023-07-10 DIAGNOSIS — N401 Enlarged prostate with lower urinary tract symptoms: Secondary | ICD-10-CM | POA: Diagnosis not present

## 2023-07-10 DIAGNOSIS — N138 Other obstructive and reflux uropathy: Secondary | ICD-10-CM

## 2023-07-10 DIAGNOSIS — N451 Epididymitis: Secondary | ICD-10-CM

## 2023-07-10 DIAGNOSIS — Z87438 Personal history of other diseases of male genital organs: Secondary | ICD-10-CM | POA: Diagnosis not present

## 2023-07-10 DIAGNOSIS — R310 Gross hematuria: Secondary | ICD-10-CM

## 2023-07-10 DIAGNOSIS — Z87898 Personal history of other specified conditions: Secondary | ICD-10-CM

## 2023-07-10 DIAGNOSIS — N529 Male erectile dysfunction, unspecified: Secondary | ICD-10-CM | POA: Diagnosis not present

## 2023-07-10 MED ORDER — SILDENAFIL CITRATE 100 MG PO TABS: ORAL_TABLET | ORAL | 11 refills | Status: DC

## 2023-08-14 ENCOUNTER — Ambulatory Visit: Payer: Medicare Other | Admitting: Urology

## 2023-12-15 NOTE — Progress Notes (Unsigned)
 12/16/23 4:09 PM   Dominic Brooks. [birth year] [id number]  Referring provider:  Antonio Baumgarten, MD [address] [facility] [place],  [place] [address]  Urological history: 1. BPH with LU TS - PSA (08/2023) 2.03 - 4.06  - tamsulosin  0.4 mg daily and finasteride  5 mg daily   2. ED - sildenafil  100 mg, on-demand-dosing   3. Right epididymitis/orchitis -Resolved  4. High risk hematuria -non smoker -cysto (11/2022) - hypervascularity of prostate   HPI: Dominic Brooks. is a 73 y.o.male who presents today for follow up.   Previous records reviewed.   I PSS 7/1  He has no urinary complaints.  He is taking the tamsulosin  and the finasteride .  Patient denies any modifying or aggravating factors.  Patient denies any recent UTI's, gross hematuria, dysuria or suprapubic/flank pain.  Patient denies any fevers, chills, nausea or vomiting.      IPSS     Row Name 12/16/23 1500         International Prostate Symptom Score   How often have you had the sensation of not emptying your bladder? Less than 1 in 5     How often have you had to urinate less than every two hours? Less than half the time     How often have you found you stopped and started again several times when you urinated? Not at All     How often have you found it difficult to postpone urination? Less than 1 in 5 times     How often have you had a weak urinary stream? Not at All     How often have you had to strain to start urination? Not at All     How many times did you typically get up at night to urinate? 3 Times     Total IPSS Score 7       Quality of Life due to urinary symptoms   If you were to spend the rest of your life with your urinary condition just the way it is now how would you feel about that? Pleased              Score:  1-7 Mild 8-19 Moderate 20-35 Severe   SHIM 18  He sometimes gets spontaneous erections in the morning.  He does not have any curvature with erections  or pain with erections.  He feels his erections are not as firm as he would like, but they are adequate for intercourse.  He does take sildenafil  100 mg on demand dosing.     SHIM     Row Name 12/16/23 1557         SHIM: Over the last 6 months:   How do you rate your confidence that you could get and keep an erection? Moderate     When you had erections with sexual stimulation, how often were your erections hard enough for penetration (entering your partner)? Sometimes (about half the time)     During sexual intercourse, how often were you able to maintain your erection after you had penetrated (entered) your partner? Most Times (much more than half the time)     During sexual intercourse, how difficult was it to maintain your erection to completion of intercourse? Slightly Difficult     When you attempted sexual intercourse, how often was it satisfactory for you? Most Times (much more than half the time)       SHIM Total Score   SHIM  18              Score: 1-7 Severe ED 8-11 Moderate ED 12-16 Mild-Moderate ED 17-21 Mild ED 22-25 No ED   PMH: Past Medical History:  Diagnosis Date   A-fib (HCC)    BPH (benign prostatic hyperplasia)    Chronic kidney disease    stage 3   Colon adenoma    Diverticulosis    Dysrhythmia    Elevated PSA    Erectile dysfunction    Gastric ulcer    History of DVT (deep vein thrombosis)    History of stomach ulcers    Hyperlipidemia    Hyperprolactinemia (HCC)    Hypertension    Seizures (HCC)    Stroke (HCC)    Tubular adenoma of colon    Urinary retention    Vitamin D deficiency     Surgical History: Past Surgical History:  Procedure Laterality Date   COLONOSCOPY     COLONOSCOPY WITH PROPOFOL  N/A 03/26/2015   Procedure: COLONOSCOPY WITH PROPOFOL ;  Surgeon: Deveron Fly, MD;  Location: [facility] ENDOSCOPY;  Service: Endoscopy;  Laterality: N/A;   COLONOSCOPY WITH PROPOFOL  N/A 08/30/2018   Procedure: COLONOSCOPY WITH PROPOFOL ;   Surgeon: Deveron Fly, MD;  Location: [facility] ENDOSCOPY;  Service: Endoscopy;  Laterality: N/A;   SHOULDER SURGERY  1991   thumb surgery  1984    Home Medications:  Allergies as of 12/16/2023       Reactions   Asa [aspirin]    History of stomach ulcers        Medication List        Accurate as of December 16, 2023  4:09 PM. If you have any questions, ask your nurse or doctor.          doxycycline  100 MG capsule Commonly known as: VIBRAMYCIN  Take 1 capsule (100 mg total) by mouth 2 (two) times daily.   finasteride  5 MG tablet Commonly known as: PROSCAR  Take 1 tablet (5 mg total) by mouth daily.   sildenafil  100 MG tablet Commonly known as: VIAGRA  TAKE 1 TABLET DAILY AS NEEDED FOR ERECTILE DYSFUNCTION   simvastatin 40 MG tablet Commonly known as: ZOCOR Take 40 mg by mouth daily.   tamsulosin  0.4 MG Caps capsule Commonly known as: FLOMAX  Take 1 capsule (0.4 mg total) by mouth daily.   verapamil  240 MG 24 hr capsule Commonly known as: VERELAN  Take 240 mg by mouth at bedtime.   warfarin 5 MG tablet Commonly known as: COUMADIN Take 5 mg by mouth as directed. Take 5 mg Monday through Thursday   warfarin 2.5 MG tablet Commonly known as: COUMADIN Take 2.5 mg by mouth as directed. Take one tablet every Friday, Saturday, and Sunday        Allergies:  Allergies  Allergen Reactions   Asa [Aspirin]     History of stomach ulcers    Family History: Family History  Problem Relation Age of Onset   Diabetes Mother    Hypertension Mother    Heart disease Father    Hypertension Father    Kidney disease Neg Hx    Prostate cancer Neg Hx     Social History:  reports that he has never smoked. He has never been exposed to tobacco smoke. He has never used smokeless tobacco. He reports that he does not currently use alcohol. He reports that he does not use drugs.   Physical Exam: BP 138/79   Pulse 67   Ht 5\' 3"  (1.6 m)  Wt 180 lb (81.6 kg)   BMI 31.89 kg/m    Constitutional:  Well nourished. Alert and oriented, No acute distress. HEENT: [place] AT, moist mucus membranes.  Trachea midline Cardiovascular: No clubbing, cyanosis, or edema. Respiratory: Normal respiratory effort, no increased work of breathing. GU: No CVA tenderness.  No bladder fullness or masses.  Patient with uncircumcised phallus.  Foreskin easily retracted.  Urethral meatus is patent.  No penile discharge. No penile lesions or rashes. Scrotum without lesions, cysts, rashes and/or edema.  Testicles are located scrotally bilaterally. No masses are appreciated in the testicles. Left and right epididymis are normal. Rectal: Patient with  normal sphincter tone. Anus and perineum without scarring or rashes. No rectal masses are appreciated. Prostate is approximately 50 + grams, could not palpate the entire gland, no nodules are appreciated. Seminal vesicles could not be palpated. Neurologic: Grossly intact, no focal deficits, moving all 4 extremities. Psychiatric: Normal mood and affect.   Laboratory Data: CBC w/auto Differential (5 Part) Order: [id number] Component Ref Range & Units 3 mo ago  WBC (White Blood Cell Count) 4.1 - 10.2 10^3/uL 6  RBC (Red Blood Cell Count) 4.69 - 6.13 10^6/uL 4.5 Low   Hemoglobin 14.1 - 18.1 gm/dL 72.5  Hematocrit 36.6 - 52.0 % 43  MCV (Mean Corpuscular Volume) 80.0 - 100.0 fl 95.6  MCH (Mean Corpuscular Hemoglobin) 27.0 - 31.2 pg 32.9 High   MCHC (Mean Corpuscular Hemoglobin Concentration) 32.0 - 36.0 gm/dL 44.0  Platelet Count 347 - 450 10^3/uL 250  RDW-CV (Red Cell Distribution Width) 11.6 - 14.8 % 12.9  MPV (Mean Platelet Volume) 9.4 - 12.4 fl 9.6  Neutrophils 1.50 - 7.80 10^3/uL 2.57  Lymphocytes 1.00 - 3.60 10^3/uL 2.81  Monocytes 0.00 - 1.50 10^3/uL 0.53  Eosinophils 0.00 - 0.55 10^3/uL 0.03  Basophils 0.00 - 0.09 10^3/uL 0.01  Neutrophil % 32.0 - 70.0 % 43  Lymphocyte % 10.0 - 50.0 % 47.1  Monocyte % 4.0 - 13.0 % 8.9   Eosinophil % 1.0 - 5.0 % 0.5 Low   Basophil% 0.0 - 2.0 % 0.2  Immature Granulocyte % <=0.7 % 0.3  Immature Granulocyte Count <=0.06 10^3/L 0.02  Resulting Agency [facility]   Specimen Collected: 08/31/23 10:58   Performed by: [facility] Last Resulted: 08/31/23 13:03  Received From: [facility]  Result Received: 12/15/23 21:10   tains abnormal data Comprehensive Metabolic Panel (CMP) Order: [id number] Component Ref Range & Units 3 mo ago  Glucose 70 - 110 mg/dL 564  Sodium 332 - 951 mmol/L 137  Potassium 3.6 - 5.1 mmol/L 4.1  Chloride 97 - 109 mmol/L 105  Carbon Dioxide (CO2) 22.0 - 32.0 mmol/L 26.3  Urea Nitrogen (BUN) 7 - 25 mg/dL 15  Creatinine 0.7 - 1.3 mg/dL 1.3  Glomerular Filtration Rate (eGFR) >60 mL/min/1.73sq m 59 Low   Comment: CKD-EPI (2021) does not include patient's race in the calculation of eGFR.  Monitoring changes of plasma creatinine and eGFR over time is useful for monitoring kidney function.  Interpretive Ranges for eGFR (CKD-EPI 2021):  eGFR:       >60 mL/min/1.73 sq. m - Normal eGFR:       30-59 mL/min/1.73 sq. m - Moderately Decreased eGFR:       15-29 mL/min/1.73 sq. m  - Severely Decreased eGFR:       < 15 mL/min/1.73 sq. m  - Kidney Failure   Note: These eGFR calculations do not apply in  acute situations when eGFR is changing rapidly or patients on dialysis.  Calcium 8.7 - 10.3 mg/dL 9.2  AST 8 - 39 U/L 15  ALT 6 - 57 U/L 14  Alk Phos (alkaline Phosphatase) 34 - 104 U/L 85  Albumin 3.5 - 4.8 g/dL 4.3  Bilirubin, Total 0.3 - 1.2 mg/dL 0.8  Protein, Total 6.1 - 7.9 g/dL 7  A/G Ratio 1.0 - 5.0 gm/dL 1.6  Resulting Agency [facility]   Specimen Collected: 08/31/23 10:58   Performed by: [facility] Last Resulted: 08/31/23 14:24  Received From: [facility]  Result Received: 12/15/23 21:10   Hemoglobin A1C Order:  [id number] Component Ref Range & Units 3 mo ago  Hemoglobin A1C 4.2 - 5.6 % 6.6 High   Average Blood Glucose (Calc) mg/dL 409  Resulting Agency [facility]  Narrative Performed by [facility] Normal Range:    4.2 - 5.6% Increased Risk:  5.7 - 6.4% Diabetes:        >= 6.5% Glycemic Control for adults with diabetes:  <7%    Specimen Collected: 08/31/23 10:58   Performed by: [facility] Last Resulted: 08/31/23 12:14  Received From: [facility]  Result Received: 12/15/23 21:10   Lipid Panel w/calc LDL Order: [id number] Component Ref Range & Units 3 mo ago  Cholesterol, Total 100 - 200 mg/dL 956  Triglyceride 35 - 199 mg/dL 67  HDL (High Density Lipoprotein) Cholesterol 29.0 - 71.0 mg/dL 21.3  LDL Calculated 0 - 130 mg/dL 81  VLDL Cholesterol mg/dL 13  Cholesterol/HDL Ratio 2.4  Resulting Agency [facility]   Specimen Collected: 08/31/23 10:58   Performed by: [facility] Last Resulted: 08/31/23 14:24  Received From: [facility]  Result Received: 12/15/23 21:10   Thyroid  Stimulating-Hormone (TSH) Order: [id number] Component Ref Range & Units 3 mo ago  Thyroid  Stimulating Hormone (TSH) 0.450-5.330 uIU/ml uIU/mL 2.233  Resulting Agency [facility] - [facility]   Specimen Collected: 08/31/23 10:58   Performed by: [facility] Last Resulted: 08/31/23 13:53  Received From: [facility]  Result Received: 12/15/23 21:10   Urinalysis w/Microscopic Order: [id number] Component Ref Range & Units 3 mo ago  Color Colorless, Straw, Light Yellow, Yellow, Dark Yellow Light Yellow  Clarity Clear Clear  Specific Gravity 1.005 - 1.030 1.015  pH, Urine 5.0 - 8.0 6  Protein, Urinalysis Negative mg/dL Trace Abnormal   Glucose, Urinalysis Negative mg/dL Negative  Ketones, Urinalysis Negative mg/dL Negative  Blood, Urinalysis Negative  Negative  Nitrite, Urinalysis Negative Negative  Leukocyte Esterase, Urinalysis Negative Negative  Bilirubin, Urinalysis Negative Negative  Urobilinogen, Urinalysis 0.2 - 1.0 mg/dL 0.2  WBC, UA <=5 /hpf <1  Red Blood Cells, Urinalysis <=3 /hpf 1  Bacteria, Urinalysis 0 - 5 /hpf 0-5  Squamous Epithelial Cells, Urinalysis /hpf 0  Resulting Agency [facility]   Specimen Collected: 08/31/23 10:58   Performed by: [facility] Last Resulted: 08/31/23 12:01  Received From: [facility]  Result Received: 12/15/23 21:10   PSA, Total (Screen) Order: [id number] Component Ref Range & Units 3 mo ago  PSA (Prostate Specific Antigen), Total 0.10 - 4.00 ng/mL 2.03  Resulting Agency [facility]  Narrative Performed by [facility] - [facility] Test results were determined with Beckman Coulter Hybritech Assay. Values  obtained with different assay methods cannot be used interchangeably in serial testing. Assay results should not be interpreted as absolute evidence of the presence or absence of malignant disease  Specimen Collected: 08/31/23 10:58   Performed by: [facility] Last Resulted: 08/31/23 13:53  Received From: [facility]  Result Received: 12/15/23 21:10  I have reviewed the labs.   Pertinent Imaging N/A  Assessment & Plan:    1. BPH with LUTS -PSA screening up to date -Continue tamsulosin  0.4 mg daily and finasteride  5 mg daily    2. Erectile dysfunction  - Continue sildenafil  100 mg, on-demand dosing  3. High risk hematuria -Secondary to hypervascular prostate -No complaints of gross hematuria -UA w/ PCP no micro heme   Return in about 1 year (around 12/15/2024) for PSA, UA, I PSS, SHIM and exam .  Matilde Son, PA-C   [facility] [address], [address] [place], [place] [address] [phone]

## 2023-12-16 ENCOUNTER — Ambulatory Visit (INDEPENDENT_AMBULATORY_CARE_PROVIDER_SITE_OTHER): Admitting: Urology

## 2023-12-16 ENCOUNTER — Encounter: Payer: Self-pay | Admitting: Urology

## 2023-12-16 VITALS — BP 138/79 | HR 67 | Ht 63.0 in | Wt 180.0 lb

## 2023-12-16 DIAGNOSIS — N401 Enlarged prostate with lower urinary tract symptoms: Secondary | ICD-10-CM | POA: Diagnosis not present

## 2023-12-16 DIAGNOSIS — R319 Hematuria, unspecified: Secondary | ICD-10-CM

## 2023-12-16 DIAGNOSIS — N529 Male erectile dysfunction, unspecified: Secondary | ICD-10-CM | POA: Diagnosis not present

## 2023-12-16 DIAGNOSIS — R972 Elevated prostate specific antigen [PSA]: Secondary | ICD-10-CM

## 2023-12-16 DIAGNOSIS — N138 Other obstructive and reflux uropathy: Secondary | ICD-10-CM

## 2023-12-16 MED ORDER — TAMSULOSIN HCL 0.4 MG PO CAPS: 0.4000 mg | ORAL_CAPSULE | Freq: Every day | ORAL | 3 refills | Status: AC

## 2023-12-16 MED ORDER — FINASTERIDE 5 MG PO TABS: 5.0000 mg | ORAL_TABLET | Freq: Every day | ORAL | 3 refills | Status: DC

## 2023-12-16 MED ORDER — SILDENAFIL CITRATE 100 MG PO TABS: ORAL_TABLET | ORAL | 11 refills | Status: DC

## 2023-12-18 ENCOUNTER — Ambulatory Visit: Payer: Self-pay | Admitting: Urology

## 2024-03-08 ENCOUNTER — Other Ambulatory Visit: Payer: Self-pay | Admitting: Urology

## 2024-03-08 DIAGNOSIS — R972 Elevated prostate specific antigen [PSA]: Secondary | ICD-10-CM

## 2024-04-04 ENCOUNTER — Ambulatory Visit: Admitting: Certified Registered"

## 2024-04-04 ENCOUNTER — Other Ambulatory Visit: Payer: Self-pay

## 2024-04-04 ENCOUNTER — Encounter
Admission: RE | Disposition: A | Discharge status: Home / Self Care | Payer: Self-pay | Source: Home / Self Care | Attending: Gastroenterology

## 2024-04-04 ENCOUNTER — Ambulatory Visit
Admission: RE | Admit: 2024-04-04 | Discharge: 2024-04-04 | Disposition: A | Discharge status: Home / Self Care | Attending: Gastroenterology | Admitting: Gastroenterology

## 2024-04-04 DIAGNOSIS — N183 Chronic kidney disease, stage 3 unspecified: Secondary | ICD-10-CM | POA: Diagnosis not present

## 2024-04-04 DIAGNOSIS — D120 Benign neoplasm of cecum: Secondary | ICD-10-CM | POA: Insufficient documentation

## 2024-04-04 DIAGNOSIS — D123 Benign neoplasm of transverse colon: Secondary | ICD-10-CM | POA: Insufficient documentation

## 2024-04-04 DIAGNOSIS — I10 Essential (primary) hypertension: Secondary | ICD-10-CM | POA: Insufficient documentation

## 2024-04-04 DIAGNOSIS — E785 Hyperlipidemia, unspecified: Secondary | ICD-10-CM | POA: Diagnosis not present

## 2024-04-04 DIAGNOSIS — R569 Unspecified convulsions: Secondary | ICD-10-CM | POA: Insufficient documentation

## 2024-04-04 DIAGNOSIS — Z86718 Personal history of other venous thrombosis and embolism: Secondary | ICD-10-CM | POA: Insufficient documentation

## 2024-04-04 DIAGNOSIS — I129 Hypertensive chronic kidney disease with stage 1 through stage 4 chronic kidney disease, or unspecified chronic kidney disease: Secondary | ICD-10-CM | POA: Diagnosis not present

## 2024-04-04 DIAGNOSIS — Z79899 Other long term (current) drug therapy: Secondary | ICD-10-CM | POA: Insufficient documentation

## 2024-04-04 DIAGNOSIS — K279 Peptic ulcer, site unspecified, unspecified as acute or chronic, without hemorrhage or perforation: Secondary | ICD-10-CM | POA: Diagnosis not present

## 2024-04-04 DIAGNOSIS — Z7901 Long term (current) use of anticoagulants: Secondary | ICD-10-CM | POA: Insufficient documentation

## 2024-04-04 DIAGNOSIS — K5730 Diverticulosis of large intestine without perforation or abscess without bleeding: Secondary | ICD-10-CM | POA: Diagnosis not present

## 2024-04-04 DIAGNOSIS — Z1211 Encounter for screening for malignant neoplasm of colon: Secondary | ICD-10-CM | POA: Diagnosis present

## 2024-04-04 DIAGNOSIS — Z8673 Personal history of transient ischemic attack (TIA), and cerebral infarction without residual deficits: Secondary | ICD-10-CM | POA: Insufficient documentation

## 2024-04-04 DIAGNOSIS — K640 First degree hemorrhoids: Secondary | ICD-10-CM | POA: Insufficient documentation

## 2024-04-04 DIAGNOSIS — I4891 Unspecified atrial fibrillation: Secondary | ICD-10-CM | POA: Diagnosis not present

## 2024-04-04 HISTORY — PX: COLONOSCOPY: SHX5424

## 2024-04-04 HISTORY — PX: POLYPECTOMY: SHX149

## 2024-04-04 SURGERY — COLONOSCOPY
Anesthesia: General

## 2024-04-04 MED ORDER — LIDOCAINE HCL (CARDIAC) PF 100 MG/5ML IV SOSY
PREFILLED_SYRINGE | INTRAVENOUS | Status: DC | PRN
  Administered 2024-04-04 (×2): 50 mg via INTRAVENOUS

## 2024-04-04 MED ORDER — EPHEDRINE 5 MG/ML INJ
INTRAVENOUS | Status: AC
  Filled 2024-04-04: qty 5

## 2024-04-04 MED ORDER — LIDOCAINE HCL (PF) 2 % IJ SOLN
INTRAMUSCULAR | Status: AC
  Filled 2024-04-04: qty 20

## 2024-04-04 MED ORDER — SODIUM CHLORIDE 0.9 % IV SOLN: INTRAVENOUS | Status: DC

## 2024-04-04 MED ORDER — PROPOFOL 10 MG/ML IV BOLUS
INTRAVENOUS | Status: DC | PRN
  Administered 2024-04-04 (×2): 70 mg via INTRAVENOUS

## 2024-04-04 MED ORDER — PROPOFOL 500 MG/50ML IV EMUL
INTRAVENOUS | Status: DC | PRN
  Administered 2024-04-04 (×2): 150 ug/kg/min via INTRAVENOUS

## 2024-04-04 MED ORDER — STERILE WATER FOR IRRIGATION IR SOLN
Status: DC | PRN
  Administered 2024-04-04 (×10): 60 mL

## 2024-04-04 NOTE — Transfer of Care (Signed)
 Immediate Anesthesia Transfer of Care Note  Patient: Dominic Brooks.  Procedure(s) Performed: COLONOSCOPY POLYPECTOMY, INTESTINE  Patient Location: Endoscopy Unit  Anesthesia Type:General  Level of Consciousness: drowsy  Airway & Oxygen Therapy: Patient Spontanous Breathing  Post-op Assessment: Report given to RN  Post vital signs: stable  Last Vitals:  Vitals Value Taken Time  BP    Temp 36 C 04/04/24 08:50  Pulse 84 04/04/24 08:51  Resp 20 04/04/24 08:51  SpO2 100 % 04/04/24 08:51  Vitals shown include unfiled device data.  Last Pain:  Vitals:   04/04/24 0754  TempSrc: Temporal  PainSc: 0-No pain         Complications: No notable events documented.

## 2024-04-04 NOTE — Anesthesia Postprocedure Evaluation (Signed)
 Anesthesia Post Note  Patient: Dominic Brooks.  Procedure(s) Performed: COLONOSCOPY POLYPECTOMY, INTESTINE  Patient location during evaluation: PACU Anesthesia Type: General Level of consciousness: awake and alert Pain management: pain level controlled Vital Signs Assessment: post-procedure vital signs reviewed and stable Respiratory status: spontaneous breathing, nonlabored ventilation, respiratory function stable and patient connected to nasal cannula oxygen Cardiovascular status: blood pressure returned to baseline and stable Postop Assessment: no apparent nausea or vomiting Anesthetic complications: no   No notable events documented.   Last Vitals:  Vitals:   04/04/24 0900 04/04/24 0910  BP: 128/83 131/88  Pulse: 76 71  Resp: 18 (!) 27  Temp:  (!) 36 C  SpO2: 99% 100%    Last Pain:  Vitals:   04/04/24 0910  TempSrc: Temporal  PainSc: 0-No pain                 Lynwood KANDICE Clause

## 2024-04-04 NOTE — Interval H&P Note (Signed)
 History and Physical Interval Note:  04/04/2024 8:24 AM  Dominic Brooks.  has presented today for surgery, with the diagnosis of HX OF ADENOMATOUS OF POLYP OF COLON.  The various methods of treatment have been discussed with the patient and family. After consideration of risks, benefits and other options for treatment, the patient has consented to  Procedure(s) with comments: COLONOSCOPY (N/A) - DM as a surgical intervention.  The patient's history has been reviewed, patient examined, no change in status, stable for surgery.  I have reviewed the patient's chart and labs.  Questions were answered to the patient's satisfaction.     Dominic Brooks  Ok to proceed with colonoscopy

## 2024-04-04 NOTE — Op Note (Signed)
 [facility] Patient Name: Dominic Brooks Procedure Date: 04/04/2024 8:18 AM MRN: [id number] Account #: [id number] Date of Birth: Jan 12, 1951 Admit Type: Outpatient Age: 73 Room: [facility] ENDO ROOM 3 Gender: Male Note Status: Finalized Instrument Name: Colon Scope [id number] Procedure:             Colonoscopy Indications:           High risk colon cancer surveillance: Personal history                         of colonic polyps, Last colonoscopy 5 years ago Providers:             Ole Schick MD, MD Referring MD:          Tamra Leventhal, MD (Referring MD) Medicines:             Monitored Anesthesia Care Complications:         No immediate complications. Estimated blood loss:                         Minimal. Procedure:             Pre-Anesthesia Assessment:                        - Prior to the procedure, a History and Physical was                         performed, and patient medications and allergies were                         reviewed. The patient is competent. The risks and                         benefits of the procedure and the sedation options and                         risks were discussed with the patient. All questions                         were answered and informed consent was obtained.                         Patient identification and proposed procedure were                         verified by the physician, the nurse, the                         anesthesiologist, the anesthetist and the technician                         in the endoscopy suite. Mental Status Examination:                         alert and oriented. Airway Examination: normal                         oropharyngeal airway and neck mobility. Respiratory  Examination: clear to auscultation. CV Examination:                         normal. Prophylactic Antibiotics: The patient does not                         require prophylactic antibiotics. Prior                          Anticoagulants: The patient has taken Coumadin                         (warfarin), last dose was 5 days prior to procedure.                         ASA Grade Assessment: III - A patient with severe                         systemic disease. After reviewing the risks and                         benefits, the patient was deemed in satisfactory                         condition to undergo the procedure. The anesthesia                         plan was to use monitored anesthesia care (MAC).                         Immediately prior to administration of medications,                         the patient was re-assessed for adequacy to receive                         sedatives. The heart rate, respiratory rate, oxygen                         saturations, blood pressure, adequacy of pulmonary                         ventilation, and response to care were monitored                         throughout the procedure. The physical status of the                         patient was re-assessed after the procedure.                        After obtaining informed consent, the colonoscope was                         passed under direct vision. Throughout the procedure,                         the patient's blood pressure, pulse, and oxygen  saturations were monitored continuously. The                         Colonoscope was introduced through the anus and                         advanced to the the cecum, identified by appendiceal                         orifice and ileocecal valve. The colonoscopy was                         somewhat difficult due to significant looping.                         Successful completion of the procedure was aided by                         applying abdominal pressure. The patient tolerated the                         procedure well. The quality of the bowel preparation                         was adequate to identify polyps. The ileocecal  valve,                         appendiceal orifice, and rectum were photographed. Findings:      The perianal and digital rectal examinations were normal.      A 4 mm polyp was found in the cecum. The polyp was sessile. The polyp       was removed with a cold snare. Resection and retrieval were complete.       Estimated blood loss was minimal.      A 7 mm polyp was found in the transverse colon. The polyp was       semi-pedunculated. The polyp was removed with a cold snare. Resection       and retrieval were complete. Estimated blood loss was minimal.      Multiple small-mouthed diverticula were found in the sigmoid colon.      Internal hemorrhoids were found during retroflexion. The hemorrhoids       were Grade I (internal hemorrhoids that do not prolapse).      The exam was otherwise without abnormality on direct and retroflexion       views. Impression:            - One 4 mm polyp in the cecum, removed with a cold                         snare. Resected and retrieved.                        - One 7 mm polyp in the transverse colon, removed with                         a cold snare. Resected and retrieved.                        -  Diverticulosis in the sigmoid colon.                        - Internal hemorrhoids.                        - The examination was otherwise normal on direct and                         retroflexion views. Recommendation:        - Discharge patient to home.                        - Resume previous diet.                        - Continue present medications.                        - Await pathology results.                        - Repeat colonoscopy in 5 years for surveillance.                        - Return to referring physician as previously                         scheduled.                        - Resume Coumadin (warfarin) at prior dose today. Procedure Code(s):     --- Professional ---                        [phone], Colonoscopy, flexible; with  removal of                         tumor(s), polyp(s), or other lesion(s) by snare                         technique Diagnosis Code(s):     --- Professional ---                        Z86.010, Personal history of colonic polyps                        D12.0, Benign neoplasm of cecum                        D12.3, Benign neoplasm of transverse colon (hepatic                         flexure or splenic flexure)                        K64.0, First degree hemorrhoids                        K57.30, Diverticulosis of large intestine without                         perforation  or abscess without bleeding CPT copyright 2022 American Medical Association. All rights reserved. The codes documented in this report are preliminary and upon coder review may  be revised to meet current compliance requirements. Ole Schick MD, MD 04/04/2024 9:04:07 AM Number of Addenda: 0 Note Initiated On: 04/04/2024 8:18 AM Scope Withdrawal Time: 0 hours 10 minutes 28 seconds  Total Procedure Duration: 0 hours 16 minutes 40 seconds  Estimated Blood Loss:  Estimated blood loss was minimal.      [facility]

## 2024-04-04 NOTE — Anesthesia Preprocedure Evaluation (Signed)
 Anesthesia Evaluation  Patient identified by MRN, date of birth, ID band Patient awake    Reviewed: Allergy & Precautions, H&P , NPO status , Patient's Chart, lab work & pertinent test results, reviewed documented beta blocker date and time   Airway Mallampati: II   Neck ROM: full    Dental  (+) Poor Dentition   Pulmonary neg pulmonary ROS   Pulmonary exam normal        Cardiovascular Exercise Tolerance: Good hypertension, Normal cardiovascular exam+ dysrhythmias  Rhythm:regular Rate:Normal     Neuro/Psych Seizures -,  CVA  negative psych ROS   GI/Hepatic Neg liver ROS, PUD,,,  Endo/Other  negative endocrine ROS    Renal/GU Renal disease  negative genitourinary   Musculoskeletal   Abdominal   Peds  Hematology negative hematology ROS (+)   Anesthesia Other Findings Past Medical History: No date: A-fib (HCC) No date: BPH (benign prostatic hyperplasia) No date: Chronic kidney disease     Comment:  stage 3 No date: Colon adenoma No date: Diverticulosis No date: Dysrhythmia No date: Elevated PSA No date: Erectile dysfunction No date: Gastric ulcer No date: History of DVT (deep vein thrombosis) No date: History of stomach ulcers No date: Hyperlipidemia No date: Hyperprolactinemia (HCC) No date: Hypertension No date: Seizures (HCC) No date: Stroke [facility]) No date: Tubular adenoma of colon No date: Urinary retention No date: Vitamin D deficiency Past Surgical History: No date: COLONOSCOPY 03/26/2015: COLONOSCOPY WITH PROPOFOL ; N/A     Comment:  Procedure: COLONOSCOPY WITH PROPOFOL ;  Surgeon: Gladis RAYMOND Mariner, MD;  Location: [facility] ENDOSCOPY;  Service:               Endoscopy;  Laterality: N/A; 08/30/2018: COLONOSCOPY WITH PROPOFOL ; N/A     Comment:  Procedure: COLONOSCOPY WITH PROPOFOL ;  Surgeon:               Mariner Gladis RAYMOND, MD;  Location: [facility] ENDOSCOPY;                Service: Endoscopy;   Laterality: N/A; 1991: SHOULDER SURGERY 1984: thumb surgery   Reproductive/Obstetrics negative OB ROS                              Anesthesia Physical Anesthesia Plan  ASA: 3  Anesthesia Plan: General   Post-op Pain Management:    Induction:   PONV Risk Score and Plan:   Airway Management Planned:   Additional Equipment:   Intra-op Plan:   Post-operative Plan:   Informed Consent: I have reviewed the patients History and Physical, chart, labs and discussed the procedure including the risks, benefits and alternatives for the proposed anesthesia with the patient or authorized representative who has indicated his/her understanding and acceptance.     Dental Advisory Given  Plan Discussed with: CRNA  Anesthesia Plan Comments:         Anesthesia Quick Evaluation

## 2024-04-04 NOTE — H&P (Signed)
 Outpatient short stay form Pre-procedure 04/04/2024  Dominic Brooks Schick, MD  Primary Physician: Sadie Manna, MD  Reason for visit:  Surveillance  History of present illness:    73 y/o gentleman with history of CVA and a. Fib on coumadin with last dose 5 days ago. Last colonoscopy 5 years ago was unremarkable. No significant abdominal surgeries. No family history of GI malignancies.    Current Facility-Administered Medications:    0.9 %  sodium chloride  infusion, , Intravenous, Continuous, Lola Czerwonka, Dominic ONEIDA, MD, Last Rate: 20 mL/hr at 04/04/24 0820, Continued from Pre-op at 04/04/24 0820  Medications Prior to Admission  Medication Sig Dispense Refill Last Dose/Taking   finasteride  (PROSCAR ) 5 MG tablet TAKE 1 TABLET DAILY 90 tablet 3 Past Week   simvastatin (ZOCOR) 40 MG tablet Take 40 mg by mouth daily.   Past Week   tamsulosin  (FLOMAX ) 0.4 MG CAPS capsule Take 1 capsule (0.4 mg total) by mouth daily. 90 capsule 3 Past Week   verapamil  (VERELAN  PM) 240 MG 24 hr capsule Take 240 mg by mouth at bedtime.   Past Week   doxycycline  (VIBRAMYCIN ) 100 MG capsule Take 1 capsule (100 mg total) by mouth 2 (two) times daily. (Patient not taking: Reported on 04/04/2024) 20 capsule 0 Not Taking   sildenafil  (VIAGRA ) 100 MG tablet TAKE 1 TABLET DAILY AS NEEDED FOR ERECTILE DYSFUNCTION 10 tablet 11    warfarin (COUMADIN) 2.5 MG tablet Take 2.5 mg by mouth as directed. Take one tablet every Friday, Saturday, and Sunday   03/29/2024   warfarin (COUMADIN) 5 MG tablet Take 5 mg by mouth as directed. Take 5 mg Monday through Thursday   03/29/2024     Allergies  Allergen Reactions   Asa [Aspirin]     History of stomach ulcers     Past Medical History:  Diagnosis Date   A-fib (HCC)    BPH (benign prostatic hyperplasia)    Chronic kidney disease    stage 3   Colon adenoma    Diverticulosis    Dysrhythmia    Elevated PSA    Erectile dysfunction    Gastric ulcer    History of DVT (deep vein  thrombosis)    History of stomach ulcers    Hyperlipidemia    Hyperprolactinemia (HCC)    Hypertension    Seizures (HCC)    Stroke (HCC)    Tubular adenoma of colon    Urinary retention    Vitamin D deficiency     Review of systems:  Otherwise negative.    Physical Exam  Gen: Alert, oriented. Appears stated age.  HEENT: PERRLA. Lungs: No respiratory distress CV: RRR Abd: soft, benign, no masses Ext: No edema    Planned procedures: Proceed with colonoscopy. The patient understands the nature of the planned procedure, indications, risks, alternatives and potential complications including but not limited to bleeding, infection, perforation, damage to internal organs and possible oversedation/side effects from anesthesia. The patient agrees and gives consent to proceed.  Please refer to procedure notes for findings, recommendations and patient disposition/instructions.     Dominic Brooks Schick, MD [facility]

## 2024-04-05 ENCOUNTER — Encounter: Payer: Self-pay | Admitting: Gastroenterology

## 2024-04-05 LAB — SURGICAL PATHOLOGY

## 2024-08-05 ENCOUNTER — Telehealth: Payer: Self-pay | Admitting: Urology

## 2024-08-05 ENCOUNTER — Other Ambulatory Visit: Payer: Self-pay | Admitting: *Deleted

## 2024-08-05 DIAGNOSIS — N529 Male erectile dysfunction, unspecified: Secondary | ICD-10-CM

## 2024-08-05 MED ORDER — SILDENAFIL CITRATE 100 MG PO TABS: ORAL_TABLET | ORAL | 3 refills | Status: AC

## 2024-08-05 NOTE — Telephone Encounter (Signed)
 Pt Called and requested medication refill for sildenafil  (VIAGRA )

## 2024-08-05 NOTE — Telephone Encounter (Signed)
 Sent viagra  in with 3 refills

## 2024-12-08 ENCOUNTER — Other Ambulatory Visit

## 2024-12-12 ENCOUNTER — Other Ambulatory Visit

## 2024-12-15 ENCOUNTER — Ambulatory Visit: Admitting: Urology

## 2024-12-21 ENCOUNTER — Ambulatory Visit: Admitting: Urology
